# Patient Record
Sex: Male | Born: 1940
Health system: Southern US, Community
[De-identification: ages and names within clinical notes are randomized; demographics above are authoritative.]

## PROBLEM LIST (undated history)

## (undated) DIAGNOSIS — I10 Essential (primary) hypertension: Secondary | ICD-10-CM

## (undated) DIAGNOSIS — C801 Malignant (primary) neoplasm, unspecified: Secondary | ICD-10-CM

## (undated) DIAGNOSIS — E119 Type 2 diabetes mellitus without complications: Secondary | ICD-10-CM

## (undated) HISTORY — DX: Essential (primary) hypertension: I10

## (undated) HISTORY — PX: EYE SURGERY: SHX253

## (undated) HISTORY — DX: Type 2 diabetes mellitus without complications: E11.9

---

## 1998-07-11 ENCOUNTER — Ambulatory Visit: Admission: RE | Admit: 1998-07-11 | Discharge: 1998-07-11 | Payer: Self-pay | Admitting: Family Medicine

## 1998-08-30 ENCOUNTER — Ambulatory Visit: Admission: RE | Admit: 1998-08-30 | Discharge: 1998-08-30 | Payer: Self-pay | Admitting: Family Medicine

## 1998-11-21 ENCOUNTER — Emergency Department (HOSPITAL_COMMUNITY): Admission: EM | Admit: 1998-11-21 | Discharge: 1998-11-21 | Payer: Self-pay | Admitting: Emergency Medicine

## 1999-01-26 ENCOUNTER — Encounter: Payer: Self-pay | Admitting: Emergency Medicine

## 1999-01-26 ENCOUNTER — Emergency Department (HOSPITAL_COMMUNITY): Admission: EM | Admit: 1999-01-26 | Discharge: 1999-01-26 | Payer: Self-pay | Admitting: Emergency Medicine

## 2002-05-03 ENCOUNTER — Encounter: Admission: RE | Admit: 2002-05-03 | Discharge: 2002-08-01 | Payer: Self-pay | Admitting: Family Medicine

## 2002-06-02 ENCOUNTER — Encounter: Admission: RE | Admit: 2002-06-02 | Discharge: 2002-06-02 | Payer: Self-pay | Admitting: Urology

## 2002-06-02 ENCOUNTER — Encounter: Payer: Self-pay | Admitting: Urology

## 2002-06-08 ENCOUNTER — Ambulatory Visit: Admission: RE | Admit: 2002-06-08 | Discharge: 2002-09-06 | Payer: Self-pay | Admitting: Radiation Oncology

## 2002-07-27 ENCOUNTER — Encounter: Admission: RE | Admit: 2002-07-27 | Discharge: 2002-07-27 | Payer: Self-pay | Admitting: Urology

## 2002-07-27 ENCOUNTER — Encounter: Payer: Self-pay | Admitting: Urology

## 2002-08-30 ENCOUNTER — Ambulatory Visit (HOSPITAL_BASED_OUTPATIENT_CLINIC_OR_DEPARTMENT_OTHER): Admission: RE | Admit: 2002-08-30 | Discharge: 2002-08-30 | Payer: Self-pay | Admitting: Urology

## 2002-08-30 ENCOUNTER — Encounter: Payer: Self-pay | Admitting: Urology

## 2002-09-20 ENCOUNTER — Ambulatory Visit: Admission: RE | Admit: 2002-09-20 | Discharge: 2002-10-05 | Payer: Self-pay | Admitting: Radiation Oncology

## 2014-07-19 ENCOUNTER — Ambulatory Visit (INDEPENDENT_AMBULATORY_CARE_PROVIDER_SITE_OTHER): Payer: 59

## 2014-07-19 VITALS — BP 152/88 | HR 77 | Resp 12

## 2014-07-19 DIAGNOSIS — M79673 Pain in unspecified foot: Secondary | ICD-10-CM

## 2014-07-19 DIAGNOSIS — E1149 Type 2 diabetes mellitus with other diabetic neurological complication: Secondary | ICD-10-CM

## 2014-07-19 DIAGNOSIS — M79609 Pain in unspecified limb: Secondary | ICD-10-CM

## 2014-07-19 DIAGNOSIS — E1142 Type 2 diabetes mellitus with diabetic polyneuropathy: Secondary | ICD-10-CM

## 2014-07-19 DIAGNOSIS — E1140 Type 2 diabetes mellitus with diabetic neuropathy, unspecified: Secondary | ICD-10-CM

## 2014-07-19 DIAGNOSIS — B351 Tinea unguium: Secondary | ICD-10-CM

## 2014-07-19 NOTE — Patient Instructions (Signed)
Diabetes and Foot Care Diabetes may cause you to have problems because of poor blood supply (circulation) to your feet and legs. This may cause the skin on your feet to become thinner, break easier, and heal more slowly. Your skin may become dry, and the skin may peel and crack. You may also have nerve damage in your legs and feet causing decreased feeling in them. You may not notice minor injuries to your feet that could lead to infections or more serious problems. Taking care of your feet is one of the most important things you can do for yourself.  HOME CARE INSTRUCTIONS  Wear shoes at all times, even in the house. Do not go barefoot. Bare feet are easily injured.  Check your feet daily for blisters, cuts, and redness. If you cannot see the bottom of your feet, use a mirror or ask someone for help.  Wash your feet with warm water (do not use hot water) and mild soap. Then pat your feet and the areas between your toes until they are completely dry. Do not soak your feet as this can dry your skin.  Apply a moisturizing lotion or petroleum jelly (that does not contain alcohol and is unscented) to the skin on your feet and to dry, brittle toenails. Do not apply lotion between your toes.  Trim your toenails straight across. Do not dig under them or around the cuticle. File the edges of your nails with an emery board or nail file.  Do not cut corns or calluses or try to remove them with medicine.  Wear clean socks or stockings every day. Make sure they are not too tight. Do not wear knee-high stockings since they may decrease blood flow to your legs.  Wear shoes that fit properly and have enough cushioning. To break in new shoes, wear them for just a few hours a day. This prevents you from injuring your feet. Always look in your shoes before you put them on to be sure there are no objects inside.  Do not cross your legs. This may decrease the blood flow to your feet.  If you find a minor scrape,  cut, or break in the skin on your feet, keep it and the skin around it clean and dry. These areas may be cleansed with mild soap and water. Do not cleanse the area with peroxide, alcohol, or iodine.  When you remove an adhesive bandage, be sure not to damage the skin around it.  If you have a wound, look at it several times a day to make sure it is healing.  Do not use heating pads or hot water bottles. They may burn your skin. If you have lost feeling in your feet or legs, you may not know it is happening until it is too late.  Make sure your health care provider performs a complete foot exam at least annually or more often if you have foot problems. Report any cuts, sores, or bruises to your health care provider immediately. SEEK MEDICAL CARE IF:   You have an injury that is not healing.  You have cuts or breaks in the skin.  You have an ingrown nail.  You notice redness on your legs or feet.  You feel burning or tingling in your legs or feet.  You have pain or cramps in your legs and feet.  Your legs or feet are numb.  Your feet always feel cold. SEEK IMMEDIATE MEDICAL CARE IF:   There is increasing redness,   swelling, or pain in or around a wound.  There is a red line that goes up your leg.  Pus is coming from a wound.  You develop a fever or as directed by your health care provider.  You notice a bad smell coming from an ulcer or wound. Document Released: 11/15/2000 Document Revised: 07/21/2013 Document Reviewed: 04/27/2013 [employer] Patient Information 2015 [employer]. This information is not intended to replace advice given to you by your health care provider. Make sure you discuss any questions you have with your health care provider.  

## 2014-07-19 NOTE — Progress Notes (Signed)
   Subjective:    Patient ID: Eric Jones, male    DOB: 01/22/1941, 73 y.o.   MRN: [id number]  HPI PT STATED BEING DIABETIC TOENAILS ARE THICK AND LONG NEED TO BE TRIM.   Review of Systems  HENT: Positive for hearing loss.   Eyes: Positive for itching.  Skin: Positive for color change.  All other systems reviewed and are negative.      Objective:   Physical Exam 73 year old white male well-developed well-nourished oriented x3 presents at this time for nail care patient's wife has tried trimming the past is unable to do so he is unable to cut his own nails thickness and discoloration deformity. Extremity objective findings as follows vascular status is intact although diminished DP and PT pulses plus one over 4 bilateral mild varicosities noted bilateral skin temperature warm to cool turgor diminished hair growth diminished absent distally nails thick brittle crumbly and friable with dystrophy discoloration and friability 1 through 5 bilateral. First bilateral most painful tender symptomatic with dark in discoloration and tenderness on palpation and shoe wear. Neurologically epicritic sensations diminished on Semmes Weinstein testing to the plantar forefoot arch and heel intact sensation dorsally and the ankle of both feet. There is normal plantar response and DTRs noted. Dermatologically skin color pigment normal hair growth absent distally nails orthotic is indicated open wounds ulcerations no secondary infection      Assessment & Plan:  Assessment this time his diabetes with history peripheral neuropathy and diabetic foot changes this time dystrophic painful mycotic nails debrided 1 through 5 bilateral return for future palliative nail care every 3 months literature and diabetic footcare also dispensed to maintain appropriate shoes and socks at all times next  [employer] DPM

## 2014-09-23 ENCOUNTER — Other Ambulatory Visit: Payer: Self-pay | Admitting: Family Medicine

## 2014-09-23 ENCOUNTER — Ambulatory Visit
Admission: RE | Admit: 2014-09-23 | Discharge: 2014-09-23 | Disposition: A | Discharge status: Home / Self Care | Payer: Medicare Other | Source: Ambulatory Visit | Attending: Family Medicine | Admitting: Family Medicine

## 2014-09-23 DIAGNOSIS — M25562 Pain in left knee: Secondary | ICD-10-CM

## 2014-10-24 ENCOUNTER — Encounter: Payer: Self-pay | Admitting: Podiatry

## 2014-10-24 ENCOUNTER — Ambulatory Visit (INDEPENDENT_AMBULATORY_CARE_PROVIDER_SITE_OTHER): Payer: 59 | Admitting: Podiatry

## 2014-10-24 DIAGNOSIS — B351 Tinea unguium: Secondary | ICD-10-CM

## 2014-10-24 DIAGNOSIS — M79676 Pain in unspecified toe(s): Secondary | ICD-10-CM

## 2014-10-24 NOTE — Progress Notes (Signed)
Patient ID: Eric Jones, male   DOB: 29-Dec-1940, 73 y.o.   MRN: [id number]  Subjective: This patient presents again complaining of painful toenails  Objective: Elongated, hypertrophic, discolored toenails 6-10  Assessment: Symptomatic onychomycoses History of peripheral neuropathy associated with diabetes  Plan: Debrided toenails 10 without a bleeding  Reappoint 3 months

## 2014-10-25 ENCOUNTER — Ambulatory Visit: Payer: 59

## 2015-01-23 ENCOUNTER — Encounter: Payer: Self-pay | Admitting: Podiatry

## 2015-01-23 ENCOUNTER — Ambulatory Visit (INDEPENDENT_AMBULATORY_CARE_PROVIDER_SITE_OTHER): Payer: Medicare Other | Admitting: Podiatry

## 2015-01-23 DIAGNOSIS — B351 Tinea unguium: Secondary | ICD-10-CM

## 2015-01-23 DIAGNOSIS — M79676 Pain in unspecified toe(s): Secondary | ICD-10-CM

## 2015-01-23 NOTE — Patient Instructions (Signed)
Diabetes and Foot Care Diabetes may cause you to have problems because of poor blood supply (circulation) to your feet and legs. This may cause the skin on your feet to become thinner, break easier, and heal more slowly. Your skin may become dry, and the skin may peel and crack. You may also have nerve damage in your legs and feet causing decreased feeling in them. You may not notice minor injuries to your feet that could lead to infections or more serious problems. Taking care of your feet is one of the most important things you can do for yourself.  HOME CARE INSTRUCTIONS  Wear shoes at all times, even in the house. Do not go barefoot. Bare feet are easily injured.  Check your feet daily for blisters, cuts, and redness. If you cannot see the bottom of your feet, use a mirror or ask someone for help.  Wash your feet with warm water (do not use hot water) and mild soap. Then pat your feet and the areas between your toes until they are completely dry. Do not soak your feet as this can dry your skin.  Apply a moisturizing lotion or petroleum jelly (that does not contain alcohol and is unscented) to the skin on your feet and to dry, brittle toenails. Do not apply lotion between your toes.  Trim your toenails straight across. Do not dig under them or around the cuticle. File the edges of your nails with an emery board or nail file.  Do not cut corns or calluses or try to remove them with medicine.  Wear clean socks or stockings every day. Make sure they are not too tight. Do not wear knee-high stockings since they may decrease blood flow to your legs.  Wear shoes that fit properly and have enough cushioning. To break in new shoes, wear them for just a few hours a day. This prevents you from injuring your feet. Always look in your shoes before you put them on to be sure there are no objects inside.  Do not cross your legs. This may decrease the blood flow to your feet.  If you find a minor scrape,  cut, or break in the skin on your feet, keep it and the skin around it clean and dry. These areas may be cleansed with mild soap and water. Do not cleanse the area with peroxide, alcohol, or iodine.  When you remove an adhesive bandage, be sure not to damage the skin around it.  If you have a wound, look at it several times a day to make sure it is healing.  Do not use heating pads or hot water bottles. They may burn your skin. If you have lost feeling in your feet or legs, you may not know it is happening until it is too late.  Make sure your health care provider performs a complete foot exam at least annually or more often if you have foot problems. Report any cuts, sores, or bruises to your health care provider immediately. SEEK MEDICAL CARE IF:   You have an injury that is not healing.  You have cuts or breaks in the skin.  You have an ingrown nail.  You notice redness on your legs or feet.  You feel burning or tingling in your legs or feet.  You have pain or cramps in your legs and feet.  Your legs or feet are numb.  Your feet always feel cold. SEEK IMMEDIATE MEDICAL CARE IF:   There is increasing redness,   swelling, or pain in or around a wound.  There is a red line that goes up your leg.  Pus is coming from a wound.  You develop a fever or as directed by your health care provider.  You notice a bad smell coming from an ulcer or wound. Document Released: 11/15/2000 Document Revised: 07/21/2013 Document Reviewed: 04/27/2013 [employer] Patient Information 2015 [employer]. This information is not intended to replace advice given to you by your health care provider. Make sure you discuss any questions you have with your health care provider.  

## 2015-01-24 NOTE — Progress Notes (Signed)
Patient ID: Eric Jones, male   DOB: 12-08-1940, 74 y.o.   MRN: [id number]  Subjective: This patient presents complaining of painful toenails and is requesting debridement  Objective: The toenails are hypertrophic, elongated, discolored and tender to palpation,6-10  Assessment: Symptomatic onychomycoses 6-10 History of diabetic peripheral neuropathy  Plan: Debrided toenails 10 without a bleeding  Reappoint 3 months

## 2015-04-24 ENCOUNTER — Ambulatory Visit: Payer: Medicare Other | Admitting: Podiatry

## 2015-05-22 ENCOUNTER — Emergency Department (HOSPITAL_COMMUNITY)
Admission: EM | Admit: 2015-05-22 | Discharge: 2015-05-22 | Disposition: A | Discharge status: Nursing Home (Includes ICF) | Payer: Medicare Other | Source: Home / Self Care | Attending: Family Medicine | Admitting: Family Medicine

## 2015-05-22 ENCOUNTER — Encounter (HOSPITAL_COMMUNITY): Payer: Self-pay | Admitting: *Deleted

## 2015-05-22 ENCOUNTER — Emergency Department (HOSPITAL_COMMUNITY): Payer: Medicare Other

## 2015-05-22 ENCOUNTER — Emergency Department (HOSPITAL_COMMUNITY)
Admission: EM | Admit: 2015-05-22 | Discharge: 2015-05-22 | Disposition: A | Discharge status: Home / Self Care | Payer: Medicare Other | Attending: Emergency Medicine | Admitting: Emergency Medicine

## 2015-05-22 ENCOUNTER — Encounter (HOSPITAL_COMMUNITY): Payer: Self-pay | Admitting: Emergency Medicine

## 2015-05-22 DIAGNOSIS — J209 Acute bronchitis, unspecified: Secondary | ICD-10-CM | POA: Diagnosis not present

## 2015-05-22 DIAGNOSIS — R0602 Shortness of breath: Secondary | ICD-10-CM

## 2015-05-22 DIAGNOSIS — I10 Essential (primary) hypertension: Secondary | ICD-10-CM | POA: Diagnosis not present

## 2015-05-22 DIAGNOSIS — Z7982 Long term (current) use of aspirin: Secondary | ICD-10-CM | POA: Insufficient documentation

## 2015-05-22 DIAGNOSIS — R600 Localized edema: Secondary | ICD-10-CM | POA: Diagnosis not present

## 2015-05-22 DIAGNOSIS — Z794 Long term (current) use of insulin: Secondary | ICD-10-CM | POA: Diagnosis not present

## 2015-05-22 DIAGNOSIS — Z79899 Other long term (current) drug therapy: Secondary | ICD-10-CM | POA: Insufficient documentation

## 2015-05-22 DIAGNOSIS — Z859 Personal history of malignant neoplasm, unspecified: Secondary | ICD-10-CM | POA: Diagnosis not present

## 2015-05-22 DIAGNOSIS — E119 Type 2 diabetes mellitus without complications: Secondary | ICD-10-CM | POA: Diagnosis not present

## 2015-05-22 DIAGNOSIS — R0609 Other forms of dyspnea: Secondary | ICD-10-CM

## 2015-05-22 HISTORY — DX: Malignant (primary) neoplasm, unspecified: C80.1

## 2015-05-22 LAB — BASIC METABOLIC PANEL
ANION GAP: 7 (ref 5–15)
BUN: 20 mg/dL (ref 6–20)
CALCIUM: 9.1 mg/dL (ref 8.9–10.3)
CO2: 26 mmol/L (ref 22–32)
Chloride: 106 mmol/L (ref 101–111)
Creatinine, Ser: 0.91 mg/dL (ref 0.61–1.24)
GFR calc Af Amer: >60 mL/min (ref >60)
Glucose, Bld: 92 mg/dL (ref 65–99)
Potassium: 4 mmol/L (ref 3.5–5.1)
Sodium: 139 mmol/L (ref 135–145)

## 2015-05-22 LAB — CBC
HCT: 42.7 % (ref 39.0–52.0)
HEMOGLOBIN: 14 g/dL (ref 13.0–17.0)
MCH: 28.6 pg (ref 26.0–34.0)
MCHC: 32.8 g/dL (ref 30.0–36.0)
MCV: 87.1 fL (ref 78.0–100.0)
PLATELETS: 363 10*3/uL (ref 150–400)
RBC: 4.9 MIL/uL (ref 4.22–5.81)
RDW: 14.2 % (ref 11.5–15.5)
WBC: 8.7 10*3/uL (ref 4.0–10.5)

## 2015-05-22 LAB — BRAIN NATRIURETIC PEPTIDE: B Natriuretic Peptide: 159.9 pg/mL — ABNORMAL HIGH (ref 0.0–100.0)

## 2015-05-22 LAB — I-STAT TROPONIN, ED: TROPONIN I, POC: 0.02 ng/mL (ref 0.00–0.08)

## 2015-05-22 LAB — D-DIMER, QUANTITATIVE: D-Dimer, Quant: 0.52 ug/mL-FEU — ABNORMAL HIGH (ref 0.00–0.48)

## 2015-05-22 MED ORDER — BENZONATATE 100 MG PO CAPS: 100.0000 mg | ORAL_CAPSULE | Freq: Three times a day (TID) | ORAL | Status: AC

## 2015-05-22 MED ORDER — IOHEXOL 350 MG/ML SOLN
80.0000 mL | Freq: Once | INTRAVENOUS | Status: AC | PRN
  Administered 2015-05-22: 80 mL via INTRAVENOUS

## 2015-05-22 MED ORDER — ALBUTEROL SULFATE HFA 108 (90 BASE) MCG/ACT IN AERS
1.0000 | INHALATION_SPRAY | Freq: Four times a day (QID) | RESPIRATORY_TRACT | Status: DC | PRN
  Administered 2015-05-22: 2 via RESPIRATORY_TRACT
  Filled 2015-05-22: qty 6.7

## 2015-05-22 NOTE — ED Notes (Signed)
Pt being transferred to the ED via our shuttle for further evaluation of CP.  Pt is A&O and ambulatory to the lobby.  Report was called to Reita C[facility] RN ED.

## 2015-05-22 NOTE — ED Notes (Signed)
Pt complains of a cough, congestion in his ears and nose, shortness of breath and right sided chest pain for one week.  He states a week ago he had right shoulder pain that radiated down his right arm and caused significant numbness in his hand.  Pt also has an elevated BP of 194/102.

## 2015-05-22 NOTE — ED Provider Notes (Signed)
Eric Jones is a 74 y.o. male who presents to Urgent Care today for shortness of breath on exertion. Patient notes a 1 week history of SOB with exertion associated with internment right hand numbness and weakness and leg swelling. He also notes an inability to lay flat at night due to SOB and cough. He notes 5/10 right chest pain. He currently does not have the chest pain but notes that it is nonexertional. No palpitations fevers or chills. He does note some wheezing. He's tried Aleve and Flonase which have not helped. Personal history of heart failure or myocardial infarction or stroke.    Past Medical History  Diagnosis Date  . Hypertension   . Diabetes mellitus without complication    History reviewed. No pertinent past surgical history. History  Substance Use Topics  . Smoking status: Never Smoker   . Smokeless tobacco: Never Used  . Alcohol Use: No   ROS as above Medications: No current facility-administered medications for this encounter.   Current Outpatient Prescriptions  Medication Sig Dispense Refill  . aspirin 81 MG tablet Take 81 mg by mouth daily.    Marland Kitchen glimepiride (AMARYL) 4 MG tablet Take 4 mg by mouth daily with breakfast.    . Insulin Glargine (LANTUS SOLOSTAR [place]) Inject into the skin.    Marland Kitchen lisinopril-hydrochlorothiazide (PRINZIDE,ZESTORETIC) 20-12.5 MG per tablet Take 1 tablet by mouth daily.    . pioglitazone (ACTOS) 30 MG tablet Take 30 mg by mouth daily.    . simvastatin (ZOCOR) 40 MG tablet Take 40 mg by mouth daily.     No Known Allergies   Exam:  BP 194/102 mmHg  Pulse 93  Temp(Src) 98.7 F (37.1 C) (Oral)  SpO2 97% Gen: Well NAD obese  HEENT: EOMI,  MMM Lungs: Normal work of breathing. CTABL Heart: RRR no MRG Abd: NABS, Soft. Nondistended, Nontender Exts: Brisk capillary refill, warm and well perfused. 1+ pitting edema bilateral lower extremities  ED ECG REPORT   Date: 05/22/2015  Rate: 88  Rhythm: normal sinus rhythm  QRS Axis: left  Intervals: normal  ST/T Wave abnormalities: Difficult to interpret due to interventricular conduction delay. No large ST segment elevation or depression present.  Conduction Disutrbances:nonspecific intraventricular conduction delay  Narrative Interpretation:   Old EKG Reviewed: none available  I have personally reviewed the EKG tracing and agree with the computerized printout as noted.   No results found for this or any previous visit (from the past 24 hour(s)). No results found.  Assessment and Plan: 74 y.o. male with shortness of breath with exertion with lower extremity edema orthopnea and intermittent chest pain. He currently is chest pain-free and does not have any lightheadedness or presyncopal feelings. Transfer to ED for further evaluation and management via shuttle.  Discussed warning signs or symptoms. Please see discharge instructions. Patient expresses understanding.     Gregor Hams, MD 05/22/15 1339

## 2015-05-22 NOTE — ED Notes (Signed)
Pt states that he has had SOB and chest pain for 1 week. Pt states that pain is right sided, intermittent and aching.

## 2015-05-22 NOTE — Discharge Instructions (Signed)
Acute Bronchitis Bronchitis is inflammation of the airways that extend from the windpipe into the lungs (bronchi). The inflammation often causes mucus to develop. This leads to a cough, which is the most common symptom of bronchitis.  In acute bronchitis, the condition usually develops suddenly and goes away over time, usually in a couple weeks. Smoking, allergies, and asthma can make bronchitis worse. Repeated episodes of bronchitis may cause further lung problems.  CAUSES Acute bronchitis is most often caused by the same virus that causes a cold. The virus can spread from person to person (contagious) through coughing, sneezing, and touching contaminated objects. SIGNS AND SYMPTOMS   Cough.   Fever.   Coughing up mucus.   Body aches.   Chest congestion.   Chills.   Shortness of breath.   Sore throat.  DIAGNOSIS  Acute bronchitis is usually diagnosed through a physical exam. Your health care provider will also ask you questions about your medical history. Tests, such as chest X-rays, are sometimes done to rule out other conditions.  TREATMENT  Acute bronchitis usually goes away in a couple weeks. Oftentimes, no medical treatment is necessary. Medicines are sometimes given for relief of fever or cough. Antibiotic medicines are usually not needed but may be prescribed in certain situations. In some cases, an inhaler may be recommended to help reduce shortness of breath and control the cough. A cool mist vaporizer may also be used to help thin bronchial secretions and make it easier to clear the chest.  HOME CARE INSTRUCTIONS  Get plenty of rest.   Drink enough fluids to keep your urine clear or pale yellow (unless you have a medical condition that requires fluid restriction). Increasing fluids may help thin your respiratory secretions (sputum) and reduce chest congestion, and it will prevent dehydration.   Take medicines only as directed by your health care provider.  If  you were prescribed an antibiotic medicine, finish it all even if you start to feel better.  Avoid smoking and secondhand smoke. Exposure to cigarette smoke or irritating chemicals will make bronchitis worse. If you are a smoker, consider using nicotine gum or skin patches to help control withdrawal symptoms. Quitting smoking will help your lungs heal faster.   Reduce the chances of another bout of acute bronchitis by washing your hands frequently, avoiding people with cold symptoms, and trying not to touch your hands to your mouth, nose, or eyes.   Keep all follow-up visits as directed by your health care provider.  SEEK MEDICAL CARE IF: Your symptoms do not improve after 1 week of treatment.  SEEK IMMEDIATE MEDICAL CARE IF:  You develop an increased fever or chills.   You have chest pain.   You have severe shortness of breath.  You have bloody sputum.   You develop dehydration.  You faint or repeatedly feel like you are going to pass out.  You develop repeated vomiting.  You develop a severe headache. MAKE SURE YOU:   Understand these instructions.  Will watch your condition.  Will get help right away if you are not doing well or get worse. Document Released: 12/26/2004 Document Revised: 04/04/2014 Document Reviewed: 05/11/2013 [facility] Patient Information 2015 [place], [place]. This information is not intended to replace advice given to you by your health care provider. Make sure you discuss any questions you have with your health care provider.  Shortness of Breath Shortness of breath means you have trouble breathing. It could also mean that you have a medical problem. You should  get immediate medical care for shortness of breath. CAUSES   Not enough oxygen in the air such as with high altitudes or a smoke-filled room.  Certain lung diseases, infections, or problems.  Heart disease or conditions, such as angina or heart failure.  Low red blood cells  (anemia).  Poor physical fitness, which can cause shortness of breath when you exercise.  Chest or back injuries or stiffness.  Being overweight.  Smoking.  Anxiety, which can make you feel like you are not getting enough air. DIAGNOSIS  Serious medical problems can often be found during your physical exam. Tests may also be done to determine why you are having shortness of breath. Tests may include:  Chest X-rays.  Lung function tests.  Blood tests.  An electrocardiogram (ECG).  An ambulatory electrocardiogram. An ambulatory ECG records your heartbeat patterns over a 24-hour period.  Exercise testing.  A transthoracic echocardiogram (TTE). During echocardiography, sound waves are used to evaluate how blood flows through your heart.  A transesophageal echocardiogram (TEE).  Imaging scans. Your health care provider may not be able to find a cause for your shortness of breath after your exam. In this case, it is important to have a follow-up exam with your health care provider as directed.  TREATMENT  Treatment for shortness of breath depends on the cause of your symptoms and can vary greatly. HOME CARE INSTRUCTIONS   Do not smoke. Smoking is a common cause of shortness of breath. If you smoke, ask for help to quit.  Avoid being around chemicals or things that may bother your breathing, such as paint fumes and dust.  Rest as needed. Slowly resume your usual activities.  If medicines were prescribed, take them as directed for the full length of time directed. This includes oxygen and any inhaled medicines.  Keep all follow-up appointments as directed by your health care provider. SEEK MEDICAL CARE IF:   Your condition does not improve in the time expected.  You have a hard time doing your normal activities even with rest.  You have any new symptoms. SEEK IMMEDIATE MEDICAL CARE IF:   Your shortness of breath gets worse.  You feel light-headed, faint, or develop a  cough not controlled with medicines.  You start coughing up blood.  You have pain with breathing.  You have chest pain or pain in your arms, shoulders, or abdomen.  You have a fever.  You are unable to walk up stairs or exercise the way you normally do. MAKE SURE YOU:  Understand these instructions.  Will watch your condition.  Will get help right away if you are not doing well or get worse. Document Released: 08/13/2001 Document Revised: 11/23/2013 Document Reviewed: 02/03/2012 [facility] Patient Information 2015 [place], [place]. This information is not intended to replace advice given to you by your health care provider. Make sure you discuss any questions you have with your health care provider.

## 2015-05-22 NOTE — ED Notes (Signed)
Pt ambulated well and spo2 sat remained between 97% & 100%

## 2015-05-22 NOTE — ED Provider Notes (Signed)
CSN: [id number]     Arrival date & time 05/22/15  1352 History   First MD Initiated Contact with Patient 05/22/15 1556     Chief Complaint  Patient presents with  . Shortness of Breath  . Chest Pain     (Consider location/radiation/quality/duration/timing/severity/associated sxs/prior Treatment) HPI Patient is a 74 year old male with past medical history of diabetes, hypertension who presents the ER complaining of 2-3 weeks of cough, one week of dyspnea on exertion and orthopnea. Patient reports cough is worse at night, it is also worse with lying flat. Patient reports an interval right-sided lower anterior chest discomfort as well. This is been intermittent throughout the week. Patient states this pain is intermittent without aggravating or alleviating factors. There is no associated exertional component to it. He states it begins for seconds to minutes at a time, then is gone for hours to days. Patient denies any associated nausea, vomiting, headache, dizziness, weakness, lightheadedness, jaw pain, arm pain, diaphoresis, palpitations, abdominal pain, diarrhea, dysuria.  Past Medical History  Diagnosis Date  . Hypertension   . Diabetes mellitus without complication   . Cancer    History reviewed. No pertinent past surgical history. No family history on file. History  Substance Use Topics  . Smoking status: Never Smoker   . Smokeless tobacco: Never Used  . Alcohol Use: No    Review of Systems  Constitutional: Negative for fever.  HENT: Negative for trouble swallowing.   Eyes: Negative for visual disturbance.  Respiratory: Positive for cough and shortness of breath.   Cardiovascular: Negative for chest pain.  Gastrointestinal: Negative for nausea, vomiting and abdominal pain.  Genitourinary: Negative for dysuria.  Musculoskeletal: Negative for neck pain.       R lower chest pain  Skin: Negative for rash.  Neurological: Negative for dizziness, weakness and numbness.   Psychiatric/Behavioral: Negative.     Allergies  Review of patient's allergies indicates no known allergies.  Home Medications   Prior to Admission medications   Medication Sig Start Date End Date Taking? Authorizing Provider  aspirin 81 MG tablet Take 81 mg by mouth daily.   Yes Historical Provider, MD  fluticasone (FLONASE) 50 MCG/ACT nasal spray Place 1 spray into both nostrils as needed for allergies or rhinitis.   Yes Historical Provider, MD  glimepiride (AMARYL) 4 MG tablet Take 4 mg by mouth daily with breakfast.   Yes Historical Provider, MD  Insulin Glargine (LANTUS SOLOSTAR) 100 UNIT/ML Solostar Pen Inject 25 Units into the skin 2 (two) times daily.   Yes Historical Provider, MD  lisinopril-hydrochlorothiazide (PRINZIDE,ZESTORETIC) 20-12.5 MG per tablet Take 1 tablet by mouth daily.   Yes Historical Provider, MD  Naproxen Sod-Diphenhydramine (ALEVE PM) 220-25 MG TABS Take 2 tablets by mouth at bedtime as needed (sleep).   Yes Historical Provider, MD  pioglitazone (ACTOS) 30 MG tablet Take 30 mg by mouth daily.   Yes Historical Provider, MD  simvastatin (ZOCOR) 40 MG tablet Take 40 mg by mouth every other day.    Yes Historical Provider, MD  benzonatate (TESSALON) 100 MG capsule Take 1 capsule (100 mg total) by mouth every 8 (eight) hours. 05/22/15   Dahlia Bailiff, PA-C   BP 166/90 mmHg  Pulse 85  Temp(Src) 98.1 F (36.7 C) (Oral)  Resp 16  SpO2 96% Physical Exam  Constitutional: He is oriented to person, place, and time. He appears well-developed and well-nourished. No distress.  HENT:  Head: Normocephalic and atraumatic.  Mouth/Throat: Oropharynx is clear and moist. No oropharyngeal  exudate.  Eyes: Right eye exhibits no discharge. Left eye exhibits no discharge. No scleral icterus.  Neck: Normal range of motion.  Cardiovascular: Normal rate, regular rhythm and normal heart sounds.   No murmur heard. Pulmonary/Chest: Effort normal. No accessory muscle usage. No tachypnea. No  respiratory distress. He has no wheezes. He has no rhonchi. He has rales in the right lower field and the left lower field.  Mild, bibasilar rales noted. No respiratory distress. Patient speaking in full, clear sentences.  Abdominal: Soft. There is no tenderness.  Musculoskeletal: Normal range of motion. He exhibits no edema or tenderness.  Lymphadenopathy:  1+ pitting edema noted bilaterally and ankles.  Neurological: He is alert and oriented to person, place, and time. He has normal strength. No cranial nerve deficit or sensory deficit. Coordination normal. GCS eye subscore is 4. GCS verbal subscore is 5. GCS motor subscore is 6.  Patient fully alert, answering questions appropriately in full, clear sentences. Cranial nerves II through XII grossly intact. Motor strength 5 out of 5 in all major muscle groups of upper and lower extremities. Distal sensation intact.   Skin: Skin is warm and dry. No rash noted. He is not diaphoretic.  Psychiatric: He has a normal mood and affect.  Nursing note and vitals reviewed.   ED Course  Procedures (including critical care time) Labs Review Labs Reviewed  BRAIN NATRIURETIC PEPTIDE - Abnormal; Notable for the following:    B Natriuretic Peptide 159.9 (*)    All other components within normal limits  D-DIMER, QUANTITATIVE (NOT AT [facility]) - Abnormal; Notable for the following:    D-Dimer, Quant 0.52 (*)    All other components within normal limits  CBC  BASIC METABOLIC PANEL  I-STAT TROPOININ, ED    Imaging Review Dg Chest 2 View  05/22/2015   CLINICAL DATA:  Shortness of breath and chest pain  EXAM: CHEST  2 VIEW  COMPARISON:  None.  FINDINGS: Limited lateral imaging due to rotation.  Interstitial coarsening which is likely bronchitic and accentuated by hypoventilation. There is no edema, consolidation, effusion, or pneumothorax. Normal heart size for technique. Negative aortic and hilar contours.  IMPRESSION: Bronchitic markings and hypoventilation.   No pneumonia or edema.   Electronically Signed   By: Monte Fantasia M.D.   On: 05/22/2015 15:30   Ct Angio Chest Pe W/cm &/or Wo Cm  05/22/2015   CLINICAL DATA:  Shortness of Breath with exertion. Symptoms for 1 week. Intermittent right hand numbness and weakness. Leg swelling. Five out a 10 chest pain on the right.  EXAM: CT ANGIOGRAPHY CHEST WITH CONTRAST  TECHNIQUE: Multidetector CT imaging of the chest was performed using the standard protocol during bolus administration of intravenous contrast. Multiplanar CT image reconstructions and MIPs were obtained to evaluate the vascular anatomy.  CONTRAST:  33mL OMNIPAQUE IOHEXOL 350 MG/ML SOLN  COMPARISON:  Chest x-ray earlier today.  FINDINGS: CT CHEST FINDINGS  Mediastinum/Nodes: No filling defects in the pulmonary arteries to suggest pulmonary emboli. Heart is mildly enlarged. Scattered calcifications in the left anterior descending coronary artery. Aorta is tortuous, non aneurysmal. No mediastinal, hilar, or axillary adenopathy.  Lungs/Pleura: Lungs are clear. No focal airspace opacities or suspicious nodules. No effusions.  Chest wall: Chest wall soft tissues are unremarkable.  Upper abdomen: No acute findings in the visualized upper abdomen.  Musculoskeletal: No acute bony abnormality or focal bone lesion.  Review of the MIP images confirms the above findings.  IMPRESSION: No evidence of pulmonary embolus.  Borderline heart size.  Scattered calcifications in the LAD.   Electronically Signed   By: Rolm Baptise M.D.   On: 05/22/2015 20:39     EKG Interpretation   Date/Time:  Monday May 22 2015 13:57:31 EDT Ventricular Rate:  89 PR Interval:  194 QRS Duration: 150 QT Interval:  412 QTC Calculation: 501 R Axis:   -61 Text Interpretation:  Normal sinus rhythm Left axis deviation Left  ventricular hypertrophy with QRS widening Abnormal ECG no significant  change since earlier in the day Confirmed by GOLDSTON  MD, SCOTT ([id number]) on  05/22/2015 3:56:52  PM      MDM   Final diagnoses:  SOB (shortness of breath)  Acute bronchitis, unspecified organism  DOE (dyspnea on exertion)    74 yom with several weeks of cough, one week of DOE and orthopnea.  Subjective cough worse at night.  Intermittent cp R lower anterior-- atypical, non-exertional. No concern for anginal chest pain. No cardiac hx.   Bibasilar rales on exam.  Bilateral LE edema.  No distress at rest.  CXR with bronchitic changes, however pt looks mildly fluid-overloaded on exam.  Acute bronchitis vs. ?fluid overload.  No cardiac w/u in past. Patient well-appearing on exam, in no acute distress. Patient having no respiratory distress throughout ER stay. EKG unremarkable for acute pathology, troponin negative after 1 week of onset of symptoms. Do not believe signs symptoms are anginal equivalent at this time.  7:00 PM: Spoke to patient regarding elevated d-dimer, discussed risks/benefits of CT angios, studies of a digested d-dimer. Patient prefers to undergo CT image at this time.  CT of chest with impression: No evidence of pulmonary embolus.  Borderline heart size. Scattered calcifications in the LAD.  Patient continues to be asymptomatic throughout ER stay. Discussed the importance of further cardiac workup given patient's risk factors along with patient not having cardiac workup in the past. With patient's cough, and mild bronchitic changes noted on x-ray coupled with overall unimpressive workup here, likely patient's signs and symptoms are attributed to a and acute bronchitis. Patient afebrile, hemodynamic be stable and in no acute distress. Patient ambulated well throughout the ED with maintaining oxygen saturations at 97% or above. Patient stable for discharge. Strongly encouraged follow-up with PCP, return precautions discussed patient verbalizes understanding and agreement of this plan.  BP 166/90 mmHg  Pulse 85  Temp(Src) 98.1 F (36.7 C) (Oral)  Resp 16  SpO2  96%  Signed,  Dahlia Bailiff, PA-C 1:48 AM  Patient seen and discussed with Dr. Sherwood Gambler, MD  Dahlia Bailiff, PA-C 05/23/15 0148  Sherwood Gambler, MD 05/26/15 1038

## 2015-05-31 ENCOUNTER — Ambulatory Visit (INDEPENDENT_AMBULATORY_CARE_PROVIDER_SITE_OTHER): Payer: Medicare Other | Admitting: Podiatry

## 2015-05-31 ENCOUNTER — Encounter: Payer: Self-pay | Admitting: Podiatry

## 2015-05-31 DIAGNOSIS — E1140 Type 2 diabetes mellitus with diabetic neuropathy, unspecified: Secondary | ICD-10-CM

## 2015-05-31 DIAGNOSIS — B351 Tinea unguium: Secondary | ICD-10-CM

## 2015-05-31 NOTE — Patient Instructions (Signed)
Removed bandage on fourth right toe 1-2 days. Apply topical anabiotic ointment to the fourth right toe daily until a scab forms  Diabetes and Foot Care Diabetes may cause you to have problems because of poor blood supply (circulation) to your feet and legs. This may cause the skin on your feet to become thinner, break easier, and heal more slowly. Your skin may become dry, and the skin may peel and crack. You may also have nerve damage in your legs and feet causing decreased feeling in them. You may not notice minor injuries to your feet that could lead to infections or more serious problems. Taking care of your feet is one of the most important things you can do for yourself.  HOME CARE INSTRUCTIONS  Wear shoes at all times, even in the house. Do not go barefoot. Bare feet are easily injured.  Check your feet daily for blisters, cuts, and redness. If you cannot see the bottom of your feet, use a mirror or ask someone for help.  Wash your feet with warm water (do not use hot water) and mild soap. Then pat your feet and the areas between your toes until they are completely dry. Do not soak your feet as this can dry your skin.  Apply a moisturizing lotion or petroleum jelly (that does not contain alcohol and is unscented) to the skin on your feet and to dry, brittle toenails. Do not apply lotion between your toes.  Trim your toenails straight across. Do not dig under them or around the cuticle. File the edges of your nails with an emery board or nail file.  Do not cut corns or calluses or try to remove them with medicine.  Wear clean socks or stockings every day. Make sure they are not too tight. Do not wear knee-high stockings since they may decrease blood flow to your legs.  Wear shoes that fit properly and have enough cushioning. To break in new shoes, wear them for just a few hours a day. This prevents you from injuring your feet. Always look in your shoes before you put them on to be sure  there are no objects inside.  Do not cross your legs. This may decrease the blood flow to your feet.  If you find a minor scrape, cut, or break in the skin on your feet, keep it and the skin around it clean and dry. These areas may be cleansed with mild soap and water. Do not cleanse the area with peroxide, alcohol, or iodine.  When you remove an adhesive bandage, be sure not to damage the skin around it.  If you have a wound, look at it several times a day to make sure it is healing.  Do not use heating pads or hot water bottles. They may burn your skin. If you have lost feeling in your feet or legs, you may not know it is happening until it is too late.  Make sure your health care provider performs a complete foot exam at least annually or more often if you have foot problems. Report any cuts, sores, or bruises to your health care provider immediately. SEEK MEDICAL CARE IF:   You have an injury that is not healing.  You have cuts or breaks in the skin.  You have an ingrown nail.  You notice redness on your legs or feet.  You feel burning or tingling in your legs or feet.  You have pain or cramps in your legs and feet.  Your legs or feet are numb.  Your feet always feel cold. SEEK IMMEDIATE MEDICAL CARE IF:   There is increasing redness, swelling, or pain in or around a wound.  There is a red line that goes up your leg.  Pus is coming from a wound.  You develop a fever or as directed by your health care provider.  You notice a bad smell coming from an ulcer or wound. Document Released: 11/15/2000 Document Revised: 07/21/2013 Document Reviewed: 04/27/2013 [facility] Patient Information 2015 [place], [place]. This information is not intended to replace advice given to you by your health care provider. Make sure you discuss any questions you have with your health care provider.

## 2015-05-31 NOTE — Progress Notes (Signed)
Patient ID: Eric Jones, male   DOB: 05-29-1941, 74 y.o.   MRN: [id number]  Subjective: This patient presents complaining of painful toenails and requesting nail debridement  Objective: The toenails are extremely elongated, brittle, discolored, incurvated 6-10  Assessment: Symptomatic onychomycosis 6-10 History of diabetic peripheral neuropathy  Plan: Debridement toenails 10 distal fourth right toe has pinpoint abrasion without any active bleeding post debridement. Anabiotic dressing applied to this area. Patient advised to continue applying topical anabiotic ointment daily after removing Band-Aid until a scab forms.  Reappoint at three-month intervals or as needed

## 2015-09-05 ENCOUNTER — Ambulatory Visit (INDEPENDENT_AMBULATORY_CARE_PROVIDER_SITE_OTHER): Payer: Medicare Other | Admitting: Podiatry

## 2015-09-05 ENCOUNTER — Encounter: Payer: Self-pay | Admitting: Podiatry

## 2015-09-05 DIAGNOSIS — E1140 Type 2 diabetes mellitus with diabetic neuropathy, unspecified: Secondary | ICD-10-CM

## 2015-09-05 DIAGNOSIS — M79676 Pain in unspecified toe(s): Secondary | ICD-10-CM

## 2015-09-05 DIAGNOSIS — B351 Tinea unguium: Secondary | ICD-10-CM

## 2015-09-05 NOTE — Patient Instructions (Signed)
Diabetes and Foot Care Diabetes may cause you to have problems because of poor blood supply (circulation) to your feet and legs. This may cause the skin on your feet to become thinner, break easier, and heal more slowly. Your skin may become dry, and the skin may peel and crack. You may also have nerve damage in your legs and feet causing decreased feeling in them. You may not notice minor injuries to your feet that could lead to infections or more serious problems. Taking care of your feet is one of the most important things you can do for yourself.  HOME CARE INSTRUCTIONS  Wear shoes at all times, even in the house. Do not go barefoot. Bare feet are easily injured.  Check your feet daily for blisters, cuts, and redness. If you cannot see the bottom of your feet, use a mirror or ask someone for help.  Wash your feet with warm water (do not use hot water) and mild soap. Then pat your feet and the areas between your toes until they are completely dry. Do not soak your feet as this can dry your skin.  Apply a moisturizing lotion or petroleum jelly (that does not contain alcohol and is unscented) to the skin on your feet and to dry, brittle toenails. Do not apply lotion between your toes.  Trim your toenails straight across. Do not dig under them or around the cuticle. File the edges of your nails with an emery board or nail file.  Do not cut corns or calluses or try to remove them with medicine.  Wear clean socks or stockings every day. Make sure they are not too tight. Do not wear knee-high stockings since they may decrease blood flow to your legs.  Wear shoes that fit properly and have enough cushioning. To break in new shoes, wear them for just a few hours a day. This prevents you from injuring your feet. Always look in your shoes before you put them on to be sure there are no objects inside.  Do not cross your legs. This may decrease the blood flow to your feet.  If you find a minor scrape,  cut, or break in the skin on your feet, keep it and the skin around it clean and dry. These areas may be cleansed with mild soap and water. Do not cleanse the area with peroxide, alcohol, or iodine.  When you remove an adhesive bandage, be sure not to damage the skin around it.  If you have a wound, look at it several times a day to make sure it is healing.  Do not use heating pads or hot water bottles. They may burn your skin. If you have lost feeling in your feet or legs, you may not know it is happening until it is too late.  Make sure your health care provider performs a complete foot exam at least annually or more often if you have foot problems. Report any cuts, sores, or bruises to your health care provider immediately. SEEK MEDICAL CARE IF:   You have an injury that is not healing.  You have cuts or breaks in the skin.  You have an ingrown nail.  You notice redness on your legs or feet.  You feel burning or tingling in your legs or feet.  You have pain or cramps in your legs and feet.  Your legs or feet are numb.  Your feet always feel cold. SEEK IMMEDIATE MEDICAL CARE IF:   There is increasing redness,   swelling, or pain in or around a wound.  There is a red line that goes up your leg.  Pus is coming from a wound.  You develop a fever or as directed by your health care provider.  You notice a bad smell coming from an ulcer or wound. Document Released: 11/15/2000 Document Revised: 07/21/2013 Document Reviewed: 04/27/2013 [employer] Patient Information 2015 [employer]. This information is not intended to replace advice given to you by your health care provider. Make sure you discuss any questions you have with your health care provider.  

## 2015-09-05 NOTE — Progress Notes (Signed)
Patient ID: Eric Jones, male   DOB: 07-30-1941, 74 y.o.   MRN: [id number]  Subjective: This patient presents for scheduled visit complaining of painful elongated toenails request nail debridement  Objective: Orientated 3 No open skin lesions noted bilaterally The toenails are elongated, brittle, incurvated, hypertrophic 6-10  Assessment: Type II diabetic with peripheral neuropathy Symptomatic onychomycoses 6-10  Plan: Debridement toenails 10 mechanically and electrically without any bleeding  Reappoint 3 months

## 2015-12-06 ENCOUNTER — Ambulatory Visit (INDEPENDENT_AMBULATORY_CARE_PROVIDER_SITE_OTHER): Payer: Medicare Other | Admitting: Podiatry

## 2015-12-06 ENCOUNTER — Encounter: Payer: Self-pay | Admitting: Podiatry

## 2015-12-06 DIAGNOSIS — M79676 Pain in unspecified toe(s): Secondary | ICD-10-CM | POA: Diagnosis not present

## 2015-12-06 DIAGNOSIS — E1140 Type 2 diabetes mellitus with diabetic neuropathy, unspecified: Secondary | ICD-10-CM | POA: Diagnosis not present

## 2015-12-06 DIAGNOSIS — B351 Tinea unguium: Secondary | ICD-10-CM

## 2015-12-06 NOTE — Progress Notes (Signed)
Patient ID: Eric Jones, male   DOB: 1941-06-27, 75 y.o.   MRN: [id number]  Subjective: This patient presents for scheduled visit complaining of elongated and thickened toenails which are on call for walking wearing shoes and requests toenail debridement  Objective: Orientated 3 No skin lesions bilaterally The toenails are incurvated, hypertrophic, discolored, deformed, and tender to direct palpation 6-10  Assessment: Symptomatic onychomycoses 6-10 Type II diabetic with peripheral neuropathy  Plan: [place] toenails 6-10 mechanically and allegedly without any bleeding  Reappoint 3 months

## 2015-12-06 NOTE — Patient Instructions (Signed)
Diabetes and Foot Care Diabetes may cause you to have problems because of poor blood supply (circulation) to your feet and legs. This may cause the skin on your feet to become thinner, break easier, and heal more slowly. Your skin may become dry, and the skin may peel and crack. You may also have nerve damage in your legs and feet causing decreased feeling in them. You may not notice minor injuries to your feet that could lead to infections or more serious problems. Taking care of your feet is one of the most important things you can do for yourself.  HOME CARE INSTRUCTIONS  Wear shoes at all times, even in the house. Do not go barefoot. Bare feet are easily injured.  Check your feet daily for blisters, cuts, and redness. If you cannot see the bottom of your feet, use a mirror or ask someone for help.  Wash your feet with warm water (do not use hot water) and mild soap. Then pat your feet and the areas between your toes until they are completely dry. Do not soak your feet as this can dry your skin.  Apply a moisturizing lotion or petroleum jelly (that does not contain alcohol and is unscented) to the skin on your feet and to dry, brittle toenails. Do not apply lotion between your toes.  Trim your toenails straight across. Do not dig under them or around the cuticle. File the edges of your nails with an emery board or nail file.  Do not cut corns or calluses or try to remove them with medicine.  Wear clean socks or stockings every day. Make sure they are not too tight. Do not wear knee-high stockings since they may decrease blood flow to your legs.  Wear shoes that fit properly and have enough cushioning. To break in new shoes, wear them for just a few hours a day. This prevents you from injuring your feet. Always look in your shoes before you put them on to be sure there are no objects inside.  Do not cross your legs. This may decrease the blood flow to your feet.  If you find a minor scrape,  cut, or break in the skin on your feet, keep it and the skin around it clean and dry. These areas may be cleansed with mild soap and water. Do not cleanse the area with peroxide, alcohol, or iodine.  When you remove an adhesive bandage, be sure not to damage the skin around it.  If you have a wound, look at it several times a day to make sure it is healing.  Do not use heating pads or hot water bottles. They may burn your skin. If you have lost feeling in your feet or legs, you may not know it is happening until it is too late.  Make sure your health care provider performs a complete foot exam at least annually or more often if you have foot problems. Report any cuts, sores, or bruises to your health care provider immediately. SEEK MEDICAL CARE IF:   You have an injury that is not healing.  You have cuts or breaks in the skin.  You have an ingrown nail.  You notice redness on your legs or feet.  You feel burning or tingling in your legs or feet.  You have pain or cramps in your legs and feet.  Your legs or feet are numb.  Your feet always feel cold. SEEK IMMEDIATE MEDICAL CARE IF:   There is increasing redness,   swelling, or pain in or around a wound.  There is a red line that goes up your leg.  Pus is coming from a wound.  You develop a fever or as directed by your health care provider.  You notice a bad smell coming from an ulcer or wound.   This information is not intended to replace advice given to you by your health care provider. Make sure you discuss any questions you have with your health care provider.   Document Released: 11/15/2000 Document Revised: 07/21/2013 Document Reviewed: 04/27/2013 Elsevier Interactive Patient Education 2016 [employer]  

## 2016-03-05 ENCOUNTER — Encounter: Payer: Self-pay | Admitting: Podiatry

## 2016-03-05 ENCOUNTER — Ambulatory Visit (INDEPENDENT_AMBULATORY_CARE_PROVIDER_SITE_OTHER): Payer: Medicare Other | Admitting: Podiatry

## 2016-03-05 DIAGNOSIS — E1140 Type 2 diabetes mellitus with diabetic neuropathy, unspecified: Secondary | ICD-10-CM | POA: Diagnosis not present

## 2016-03-05 DIAGNOSIS — M79676 Pain in unspecified toe(s): Secondary | ICD-10-CM | POA: Diagnosis not present

## 2016-03-05 DIAGNOSIS — B351 Tinea unguium: Secondary | ICD-10-CM

## 2016-03-05 NOTE — Progress Notes (Signed)
Patient ID: Eric Jones, male   DOB: 02-08-1941, 75 y.o.   MRN: [id number]    Subjective: This patient presents for scheduled visit complaining of elongated and thickened toenails which are on call for walking wearing shoes and requests toenail debridement  Objective: Orientated 3 No skin lesions bilaterally The toenails are incurvated, hypertrophic, discolored, deformed, and tender to direct palpation 6-10  Assessment: Symptomatic onychomycoses 6-10 Type II diabetic with peripheral neuropathy  Plan: Debridement of toenails 6-10 mechanically and electrically without any bleeding  Reappoint 3 months

## 2016-03-05 NOTE — Patient Instructions (Signed)
Diabetes and Foot Care Diabetes may cause you to have problems because of poor blood supply (circulation) to your feet and legs. This may cause the skin on your feet to become thinner, break easier, and heal more slowly. Your skin may become dry, and the skin may peel and crack. You may also have nerve damage in your legs and feet causing decreased feeling in them. You may not notice minor injuries to your feet that could lead to infections or more serious problems. Taking care of your feet is one of the most important things you can do for yourself.  HOME CARE INSTRUCTIONS  Wear shoes at all times, even in the house. Do not go barefoot. Bare feet are easily injured.  Check your feet daily for blisters, cuts, and redness. If you cannot see the bottom of your feet, use a mirror or ask someone for help.  Wash your feet with warm water (do not use hot water) and mild soap. Then pat your feet and the areas between your toes until they are completely dry. Do not soak your feet as this can dry your skin.  Apply a moisturizing lotion or petroleum jelly (that does not contain alcohol and is unscented) to the skin on your feet and to dry, brittle toenails. Do not apply lotion between your toes.  Trim your toenails straight across. Do not dig under them or around the cuticle. File the edges of your nails with an emery board or nail file.  Do not cut corns or calluses or try to remove them with medicine.  Wear clean socks or stockings every day. Make sure they are not too tight. Do not wear knee-high stockings since they may decrease blood flow to your legs.  Wear shoes that fit properly and have enough cushioning. To break in new shoes, wear them for just a few hours a day. This prevents you from injuring your feet. Always look in your shoes before you put them on to be sure there are no objects inside.  Do not cross your legs. This may decrease the blood flow to your feet.  If you find a minor scrape,  cut, or break in the skin on your feet, keep it and the skin around it clean and dry. These areas may be cleansed with mild soap and water. Do not cleanse the area with peroxide, alcohol, or iodine.  When you remove an adhesive bandage, be sure not to damage the skin around it.  If you have a wound, look at it several times a day to make sure it is healing.  Do not use heating pads or hot water bottles. They may burn your skin. If you have lost feeling in your feet or legs, you may not know it is happening until it is too late.  Make sure your health care provider performs a complete foot exam at least annually or more often if you have foot problems. Report any cuts, sores, or bruises to your health care provider immediately. SEEK MEDICAL CARE IF:   You have an injury that is not healing.  You have cuts or breaks in the skin.  You have an ingrown nail.  You notice redness on your legs or feet.  You feel burning or tingling in your legs or feet.  You have pain or cramps in your legs and feet.  Your legs or feet are numb.  Your feet always feel cold. SEEK IMMEDIATE MEDICAL CARE IF:   There is increasing redness,   swelling, or pain in or around a wound.  There is a red line that goes up your leg.  Pus is coming from a wound.  You develop a fever or as directed by your health care provider.  You notice a bad smell coming from an ulcer or wound.   This information is not intended to replace advice given to you by your health care provider. Make sure you discuss any questions you have with your health care provider.   Document Released: 11/15/2000 Document Revised: 07/21/2013 Document Reviewed: 04/27/2013 Elsevier Interactive Patient Education 2016 [employer]  

## 2016-06-05 ENCOUNTER — Ambulatory Visit (INDEPENDENT_AMBULATORY_CARE_PROVIDER_SITE_OTHER): Payer: Medicare Other | Admitting: Podiatry

## 2016-06-05 ENCOUNTER — Encounter: Payer: Self-pay | Admitting: Podiatry

## 2016-06-05 DIAGNOSIS — M79676 Pain in unspecified toe(s): Secondary | ICD-10-CM | POA: Diagnosis not present

## 2016-06-05 DIAGNOSIS — E1140 Type 2 diabetes mellitus with diabetic neuropathy, unspecified: Secondary | ICD-10-CM | POA: Diagnosis not present

## 2016-06-05 DIAGNOSIS — B351 Tinea unguium: Secondary | ICD-10-CM | POA: Diagnosis not present

## 2016-06-05 NOTE — Patient Instructions (Signed)
Diabetes and Foot Care Diabetes may cause you to have problems because of poor blood supply (circulation) to your feet and legs. This may cause the skin on your feet to become thinner, break easier, and heal more slowly. Your skin may become dry, and the skin may peel and crack. You may also have nerve damage in your legs and feet causing decreased feeling in them. You may not notice minor injuries to your feet that could lead to infections or more serious problems. Taking care of your feet is one of the most important things you can do for yourself.  HOME CARE INSTRUCTIONS  Wear shoes at all times, even in the house. Do not go barefoot. Bare feet are easily injured.  Check your feet daily for blisters, cuts, and redness. If you cannot see the bottom of your feet, use a mirror or ask someone for help.  Wash your feet with warm water (do not use hot water) and mild soap. Then pat your feet and the areas between your toes until they are completely dry. Do not soak your feet as this can dry your skin.  Apply a moisturizing lotion or petroleum jelly (that does not contain alcohol and is unscented) to the skin on your feet and to dry, brittle toenails. Do not apply lotion between your toes.  Trim your toenails straight across. Do not dig under them or around the cuticle. File the edges of your nails with an emery board or nail file.  Do not cut corns or calluses or try to remove them with medicine.  Wear clean socks or stockings every day. Make sure they are not too tight. Do not wear knee-high stockings since they may decrease blood flow to your legs.  Wear shoes that fit properly and have enough cushioning. To break in new shoes, wear them for just a few hours a day. This prevents you from injuring your feet. Always look in your shoes before you put them on to be sure there are no objects inside.  Do not cross your legs. This may decrease the blood flow to your feet.  If you find a minor scrape,  cut, or break in the skin on your feet, keep it and the skin around it clean and dry. These areas may be cleansed with mild soap and water. Do not cleanse the area with peroxide, alcohol, or iodine.  When you remove an adhesive bandage, be sure not to damage the skin around it.  If you have a wound, look at it several times a day to make sure it is healing.  Do not use heating pads or hot water bottles. They may burn your skin. If you have lost feeling in your feet or legs, you may not know it is happening until it is too late.  Make sure your health care provider performs a complete foot exam at least annually or more often if you have foot problems. Report any cuts, sores, or bruises to your health care provider immediately. SEEK MEDICAL CARE IF:   You have an injury that is not healing.  You have cuts or breaks in the skin.  You have an ingrown nail.  You notice redness on your legs or feet.  You feel burning or tingling in your legs or feet.  You have pain or cramps in your legs and feet.  Your legs or feet are numb.  Your feet always feel cold. SEEK IMMEDIATE MEDICAL CARE IF:   There is increasing redness,   swelling, or pain in or around a wound.  There is a red line that goes up your leg.  Pus is coming from a wound.  You develop a fever or as directed by your health care provider.  You notice a bad smell coming from an ulcer or wound.   This information is not intended to replace advice given to you by your health care provider. Make sure you discuss any questions you have with your health care provider.   Document Released: 11/15/2000 Document Revised: 07/21/2013 Document Reviewed: 04/27/2013 Elsevier Interactive Patient Education 2016 [employer]  

## 2016-06-06 NOTE — Progress Notes (Signed)
Patient ID: Eric Jones, male   DOB: [birth year], 75 y.o.   MRN: [id number]  Subjective: This patient presents for scheduled visit complaining of elongated and thickened toenails which are on call for walking wearing shoes and requests toenail debridement  Objective: Orientated 3 No skin lesions bilaterally The toenails are incurvated, hypertrophic, discolored, deformed, and tender to direct palpation 6-10  Assessment: Symptomatic onychomycoses 6-10 Type II diabetic with peripheral neuropathy  Plan: Debridement of toenails 6-10 mechanically and electrically without any bleeding  Reappoint 3 months

## 2016-09-04 ENCOUNTER — Encounter: Payer: Self-pay | Admitting: Podiatry

## 2016-09-04 ENCOUNTER — Ambulatory Visit (INDEPENDENT_AMBULATORY_CARE_PROVIDER_SITE_OTHER): Payer: Medicare Other | Admitting: Podiatry

## 2016-09-04 VITALS — BP 186/95 | HR 80 | Resp 18

## 2016-09-04 DIAGNOSIS — B351 Tinea unguium: Secondary | ICD-10-CM | POA: Diagnosis not present

## 2016-09-04 DIAGNOSIS — M79676 Pain in unspecified toe(s): Secondary | ICD-10-CM | POA: Diagnosis not present

## 2016-09-04 NOTE — Progress Notes (Signed)
Patient ID: Eric Jones, male   DOB: 01/23/1941, 75 y.o.   MRN: [id number]  Subjective: This patient presents for scheduled visit complaining of elongated and thickened toenails which are on call for walking wearing shoes and requests toenail debridement  Objective: Orientated 3 He and PT pulses 2/4 bilaterally Capillary reflex delayed bilaterally Sensation to 10 g monofilament wire intact 5/5 bilaterally Vibratory sensation reactive bilaterally Ankle reflex equal and reactive bilaterally Hammertoe second bilaterally Prominent posterior heel spur bilaterally No skin lesions bilaterally The toenails are incurvated, hypertrophic, discolored, deformed, and tender to direct palpation 6-10  Assessment: Symptomatic onychomycoses 6-10 Type II diabetic with protective sensation intact Vascular status adequate  Plan: Debridement of toenails 6-10 mechanically and electrically without any bleeding  Reappoint 3 months

## 2016-09-04 NOTE — Patient Instructions (Signed)
Diabetes and Foot Care Diabetes may cause you to have problems because of poor blood supply (circulation) to your feet and legs. This may cause the skin on your feet to become thinner, break easier, and heal more slowly. Your skin may become dry, and the skin may peel and crack. You may also have nerve damage in your legs and feet causing decreased feeling in them. You may not notice minor injuries to your feet that could lead to infections or more serious problems. Taking care of your feet is one of the most important things you can do for yourself.  HOME CARE INSTRUCTIONS  Wear shoes at all times, even in the house. Do not go barefoot. Bare feet are easily injured.  Check your feet daily for blisters, cuts, and redness. If you cannot see the bottom of your feet, use a mirror or ask someone for help.  Wash your feet with warm water (do not use hot water) and mild soap. Then pat your feet and the areas between your toes until they are completely dry. Do not soak your feet as this can dry your skin.  Apply a moisturizing lotion or petroleum jelly (that does not contain alcohol and is unscented) to the skin on your feet and to dry, brittle toenails. Do not apply lotion between your toes.  Trim your toenails straight across. Do not dig under them or around the cuticle. File the edges of your nails with an emery board or nail file.  Do not cut corns or calluses or try to remove them with medicine.  Wear clean socks or stockings every day. Make sure they are not too tight. Do not wear knee-high stockings since they may decrease blood flow to your legs.  Wear shoes that fit properly and have enough cushioning. To break in new shoes, wear them for just a few hours a day. This prevents you from injuring your feet. Always look in your shoes before you put them on to be sure there are no objects inside.  Do not cross your legs. This may decrease the blood flow to your feet.  If you find a minor scrape,  cut, or break in the skin on your feet, keep it and the skin around it clean and dry. These areas may be cleansed with mild soap and water. Do not cleanse the area with peroxide, alcohol, or iodine.  When you remove an adhesive bandage, be sure not to damage the skin around it.  If you have a wound, look at it several times a day to make sure it is healing.  Do not use heating pads or hot water bottles. They may burn your skin. If you have lost feeling in your feet or legs, you may not know it is happening until it is too late.  Make sure your health care provider performs a complete foot exam at least annually or more often if you have foot problems. Report any cuts, sores, or bruises to your health care provider immediately. SEEK MEDICAL CARE IF:   You have an injury that is not healing.  You have cuts or breaks in the skin.  You have an ingrown nail.  You notice redness on your legs or feet.  You feel burning or tingling in your legs or feet.  You have pain or cramps in your legs and feet.  Your legs or feet are numb.  Your feet always feel cold. SEEK IMMEDIATE MEDICAL CARE IF:   There is increasing redness,   swelling, or pain in or around a wound.  There is a red line that goes up your leg.  Pus is coming from a wound.  You develop a fever or as directed by your health care provider.  You notice a bad smell coming from an ulcer or wound.   This information is not intended to replace advice given to you by your health care provider. Make sure you discuss any questions you have with your health care provider.   Document Released: 11/15/2000 Document Revised: 07/21/2013 Document Reviewed: 04/27/2013 Elsevier Interactive Patient Education 2016 [employer]  

## 2016-12-04 ENCOUNTER — Ambulatory Visit: Payer: Medicare Other | Admitting: Podiatry

## 2016-12-10 ENCOUNTER — Ambulatory Visit (INDEPENDENT_AMBULATORY_CARE_PROVIDER_SITE_OTHER): Payer: Medicare Other | Admitting: Podiatry

## 2016-12-10 ENCOUNTER — Encounter: Payer: Self-pay | Admitting: Podiatry

## 2016-12-10 VITALS — BP 183/101 | HR 80 | Resp 16

## 2016-12-10 DIAGNOSIS — B351 Tinea unguium: Secondary | ICD-10-CM | POA: Diagnosis not present

## 2016-12-10 DIAGNOSIS — M79676 Pain in unspecified toe(s): Secondary | ICD-10-CM | POA: Diagnosis not present

## 2016-12-10 NOTE — Progress Notes (Signed)
Patient ID: Eric Jones, male   DOB: 02/12/1941, 76 y.o.   MRN: [id number]    Subjective: This patient presents for scheduled visit complaining of elongated and thickened toenails which are on call for walking wearing shoes and requests toenail debridement  Objective: Orientated 3 He and PT pulses 2/4 bilaterally Capillary reflex delayed bilaterally Sensation to 10 g monofilament wire intact 5/5 bilaterally Vibratory sensation reactive bilaterally Ankle reflex equal and reactive bilaterally Hammertoe second bilaterally Prominent posterior heel spur bilaterally No skin lesions bilaterally The toenails are incurvated, hypertrophic, discolored, deformed, and tender to direct palpation 6-10  Assessment: Symptomatic onychomycoses 6-10 Type II diabetic with protective sensation intact Vascular status adequate  Plan: Debridement of toenails 6-10 mechanically and electrically without any bleeding  Reappoint 3 months

## 2016-12-10 NOTE — Patient Instructions (Signed)

## 2017-03-11 ENCOUNTER — Ambulatory Visit: Payer: Medicare Other | Admitting: Podiatry

## 2017-04-09 ENCOUNTER — Encounter: Payer: Self-pay | Admitting: Podiatry

## 2017-04-09 ENCOUNTER — Ambulatory Visit (INDEPENDENT_AMBULATORY_CARE_PROVIDER_SITE_OTHER): Payer: Medicare Other | Admitting: Podiatry

## 2017-04-09 DIAGNOSIS — E1140 Type 2 diabetes mellitus with diabetic neuropathy, unspecified: Secondary | ICD-10-CM | POA: Diagnosis not present

## 2017-04-09 DIAGNOSIS — B351 Tinea unguium: Secondary | ICD-10-CM | POA: Diagnosis not present

## 2017-04-09 DIAGNOSIS — M79676 Pain in unspecified toe(s): Secondary | ICD-10-CM | POA: Diagnosis not present

## 2017-04-09 NOTE — Patient Instructions (Signed)

## 2017-04-09 NOTE — Progress Notes (Signed)
Patient ID: Eric Jones, male   DOB: 1941/02/25, 76 y.o.   MRN: [id number]    Subjective: This patient presents for scheduled visit complaining of elongated and thickened toenails which are on call for walking wearing shoes and requests toenail debridement  Objective: Orientated 3 He and PT pulses 2/4 bilaterally Capillary reflex delayed bilaterally Sensation to 10 g monofilament wire intact 5/5 bilaterally Vibratory sensation reactive bilaterally Ankle reflex equal and reactive bilaterally Hammertoe second bilaterally HAV left Prominent posterior heel spur bilaterally No skin lesions bilaterally The toenails are incurvated, hypertrophic, discolored, deformed, and tender to direct palpation 6-10 Atrophic skin with absent hair growth bilaterally  Assessment: Symptomatic onychomycoses 6-10 Type II diabetic with protective sensation intact Vascular status adequate  Plan: Debridement of toenails 6-10 mechanically and electrically without any bleeding  Reappoint 3 months

## 2017-07-15 ENCOUNTER — Ambulatory Visit (INDEPENDENT_AMBULATORY_CARE_PROVIDER_SITE_OTHER): Payer: Medicare Other | Admitting: Podiatry

## 2017-07-15 ENCOUNTER — Encounter: Payer: Self-pay | Admitting: Podiatry

## 2017-07-15 DIAGNOSIS — B351 Tinea unguium: Secondary | ICD-10-CM

## 2017-07-15 DIAGNOSIS — M79676 Pain in unspecified toe(s): Secondary | ICD-10-CM

## 2017-07-15 NOTE — Progress Notes (Signed)
Patient ID: Eric Jones, male   DOB: 01-31-1941, 76 y.o.   MRN: [id number]    Subjective: This patient presents for scheduled visit complaining of elongated and thickened toenails which are on call for walking wearing shoes and requests toenail debridement  Objective: Orientated 3 He and PT pulses 2/4 bilaterally Capillary reflex delayed bilaterally Sensation to 10 g monofilament wire intact 5/5 bilaterally Vibratory sensation reactive bilaterally Ankle reflex equal and reactive bilaterally Hammertoe second bilaterally HAV left Prominent posterior heel spur bilaterally No skin lesions bilaterally The toenails are incurvated, hypertrophic, discolored, deformed, and tender to direct palpation 6-10 Atrophic skin with absent hair growth bilaterally  Assessment: Symptomatic onychomycoses 6-10 Type II diabetic with protective sensation intact Vascular status adequate  Plan: Debridement of toenails 6-10 mechanically and electrically without any bleeding  Reappoint 3 months

## 2017-07-15 NOTE — Patient Instructions (Signed)

## 2017-10-20 ENCOUNTER — Ambulatory Visit (INDEPENDENT_AMBULATORY_CARE_PROVIDER_SITE_OTHER): Payer: Medicare Other | Admitting: Podiatry

## 2017-10-20 ENCOUNTER — Encounter: Payer: Self-pay | Admitting: Podiatry

## 2017-10-20 DIAGNOSIS — B351 Tinea unguium: Secondary | ICD-10-CM | POA: Diagnosis not present

## 2017-10-20 DIAGNOSIS — E1140 Type 2 diabetes mellitus with diabetic neuropathy, unspecified: Secondary | ICD-10-CM

## 2017-10-20 DIAGNOSIS — M79676 Pain in unspecified toe(s): Secondary | ICD-10-CM

## 2017-10-20 NOTE — Patient Instructions (Signed)
Remove the Band-Aid and a fourth left toe 1-3 days and apply topical antibiotic ointment and Band-Aid until a scab forms  Diabetes and Foot Care Diabetes may cause you to have problems because of poor blood supply (circulation) to your feet and legs. This may cause the skin on your feet to become thinner, break easier, and heal more slowly. Your skin may become dry, and the skin may peel and crack. You may also have nerve damage in your legs and feet causing decreased feeling in them. You may not notice minor injuries to your feet that could lead to infections or more serious problems. Taking care of your feet is one of the most important things you can do for yourself. Follow these instructions at home:  Wear shoes at all times, even in the house. Do not go barefoot. Bare feet are easily injured.  Check your feet daily for blisters, cuts, and redness. If you cannot see the bottom of your feet, use a mirror or ask someone for help.  Wash your feet with warm water (do not use hot water) and mild soap. Then pat your feet and the areas between your toes until they are completely dry. Do not soak your feet as this can dry your skin.  Apply a moisturizing lotion or petroleum jelly (that does not contain alcohol and is unscented) to the skin on your feet and to dry, brittle toenails. Do not apply lotion between your toes.  Trim your toenails straight across. Do not dig under them or around the cuticle. File the edges of your nails with an emery board or nail file.  Do not cut corns or calluses or try to remove them with medicine.  Wear clean socks or stockings every day. Make sure they are not too tight. Do not wear knee-high stockings since they may decrease blood flow to your legs.  Wear shoes that fit properly and have enough cushioning. To break in new shoes, wear them for just a few hours a day. This prevents you from injuring your feet. Always look in your shoes before you put them on to be sure  there are no objects inside.  Do not cross your legs. This may decrease the blood flow to your feet.  If you find a minor scrape, cut, or break in the skin on your feet, keep it and the skin around it clean and dry. These areas may be cleansed with mild soap and water. Do not cleanse the area with peroxide, alcohol, or iodine.  When you remove an adhesive bandage, be sure not to damage the skin around it.  If you have a wound, look at it several times a day to make sure it is healing.  Do not use heating pads or hot water bottles. They may burn your skin. If you have lost feeling in your feet or legs, you may not know it is happening until it is too late.  Make sure your health care provider performs a complete foot exam at least annually or more often if you have foot problems. Report any cuts, sores, or bruises to your health care provider immediately. Contact a health care provider if:  You have an injury that is not healing.  You have cuts or breaks in the skin.  You have an ingrown nail.  You notice redness on your legs or feet.  You feel burning or tingling in your legs or feet.  You have pain or cramps in your legs and feet.  Your legs or feet are numb.  Your feet always feel cold. Get help right away if:  There is increasing redness, swelling, or pain in or around a wound.  There is a red line that goes up your leg.  Pus is coming from a wound.  You develop a fever or as directed by your health care provider.  You notice a bad smell coming from an ulcer or wound. This information is not intended to replace advice given to you by your health care provider. Make sure you discuss any questions you have with your health care provider. Document Released: 11/15/2000 Document Revised: 04/25/2016 Document Reviewed: 04/27/2013 Elsevier Interactive Patient Education  2017 [employer].

## 2017-10-20 NOTE — Progress Notes (Signed)
Patient ID: Eric Jones, male   DOB: [birth year], 76 y.o.   MRN: [id number]   Subjective: This patient presents for scheduled visit complaining of elongated and thickened toenails which are on call for walking wearing shoes and requests toenail debridement  Objective: Orientated 3 He and PT pulses 2/4 bilaterally Capillary reflex delayed bilaterally Sensation to 10 g monofilament wire intact 5/5 bilaterally Vibratory sensation reactive bilaterally Ankle reflex equal and reactive bilaterally Hammertoe second bilaterally HAV left Prominent posterior heel spur bilaterally No skin lesions bilaterally The toenails are incurvated, hypertrophic, discolored, deformed, and tender to direct palpation 6-10 Atrophic skin with absent hair growth bilaterally  Assessment: Symptomatic onychomycoses 6-10 Type II diabetic with protective sensation intact Vascular status adequate  Plan: Debridement of toenails 6-10 mechanically and electrically with slight bleeding distal fourth left toe, treated with topical antibiotic ointment and Band-Aid. Patient instructed removed Band-Aid 1-3 days and apply topical antibiotic ointment and Band-Aid until a scab forms  Reappoint 3 months

## 2018-01-07 ENCOUNTER — Encounter: Payer: Self-pay | Admitting: Podiatry

## 2018-01-07 ENCOUNTER — Ambulatory Visit (INDEPENDENT_AMBULATORY_CARE_PROVIDER_SITE_OTHER): Payer: Medicare Other | Admitting: Podiatry

## 2018-01-07 DIAGNOSIS — M79675 Pain in left toe(s): Secondary | ICD-10-CM

## 2018-01-07 DIAGNOSIS — B351 Tinea unguium: Secondary | ICD-10-CM | POA: Diagnosis not present

## 2018-01-07 DIAGNOSIS — M79674 Pain in right toe(s): Secondary | ICD-10-CM

## 2018-01-07 DIAGNOSIS — E1140 Type 2 diabetes mellitus with diabetic neuropathy, unspecified: Secondary | ICD-10-CM

## 2018-01-07 NOTE — Progress Notes (Signed)
Complaint:  Visit Type: Patient returns to my office for continued preventative foot care services. Complaint: Patient states" my nails have grown long and thick and become painful to walk and wear shoes" Patient has been diagnosed with DM with no foot complications. The patient presents for preventative foot care services. No changes to ROS  Podiatric Exam: Vascular: dorsalis pedis and posterior tibial pulses are palpable bilateral. Capillary return is immediate. Temperature gradient is WNL. Skin turgor WNL  Sensorium: Normal Semmes Weinstein monofilament test. Normal tactile sensation bilaterally. Nail Exam: Pt has thick disfigured discolored nails with subungual debris noted bilateral entire nail hallux through fifth toenails Ulcer Exam: There is no evidence of ulcer or pre-ulcerative changes or infection. Orthopedic Exam: Muscle tone and strength are WNL. No limitations in general ROM. No crepitus or effusions noted. Foot type and digits show no abnormalities. Bony prominences are unremarkable. Skin: No Porokeratosis. No infection or ulcers  Diagnosis:  Onychomycosis, , Pain in right toe, pain in left toes  Treatment & Plan Procedures and Treatment: Consent by patient was obtained for treatment procedures.   Debridement of mycotic and hypertrophic toenails, 1 through 5 bilateral and clearing of subungual debris. No ulceration, no infection noted.  Return Visit-Office Procedure: Patient instructed to return to the office for a follow up visit 3 months for continued evaluation and treatment.    Ariyona Eid DPM 

## 2018-01-19 ENCOUNTER — Ambulatory Visit: Payer: Medicare Other | Admitting: Podiatry

## 2018-04-10 ENCOUNTER — Encounter: Payer: Self-pay | Admitting: Podiatry

## 2018-04-10 ENCOUNTER — Ambulatory Visit (INDEPENDENT_AMBULATORY_CARE_PROVIDER_SITE_OTHER): Payer: Medicare Other | Admitting: Podiatry

## 2018-04-10 DIAGNOSIS — M79674 Pain in right toe(s): Secondary | ICD-10-CM

## 2018-04-10 DIAGNOSIS — M79675 Pain in left toe(s): Secondary | ICD-10-CM

## 2018-04-10 DIAGNOSIS — B351 Tinea unguium: Secondary | ICD-10-CM | POA: Diagnosis not present

## 2018-04-10 DIAGNOSIS — E1140 Type 2 diabetes mellitus with diabetic neuropathy, unspecified: Secondary | ICD-10-CM

## 2018-04-10 NOTE — Progress Notes (Signed)
Complaint:  Visit Type: Patient returns to my office for continued preventative foot care services. Complaint: Patient states" my nails have grown long and thick and become painful to walk and wear shoes" Patient has been diagnosed with DM with no foot complications. The patient presents for preventative foot care services. No changes to ROS  Podiatric Exam: Vascular: dorsalis pedis and posterior tibial pulses are palpable bilateral. Capillary return is immediate. Temperature gradient is WNL. Skin turgor WNL  Sensorium: Normal Semmes Weinstein monofilament test. Normal tactile sensation bilaterally. Nail Exam: Pt has thick disfigured discolored nails with subungual debris noted bilateral entire nail hallux through fifth toenails Ulcer Exam: There is no evidence of ulcer or pre-ulcerative changes or infection. Orthopedic Exam: Muscle tone and strength are WNL. No limitations in general ROM. No crepitus or effusions noted. Foot type and digits show no abnormalities. Bony prominences are unremarkable. Skin: No Porokeratosis. No infection or ulcers  Diagnosis:  Onychomycosis, , Pain in right toe, pain in left toes  Treatment & Plan Procedures and Treatment: Consent by patient was obtained for treatment procedures.   Debridement of mycotic and hypertrophic toenails, 1 through 5 bilateral and clearing of subungual debris. No ulceration, no infection noted.  Return Visit-Office Procedure: Patient instructed to return to the office for a follow up visit 3 months for continued evaluation and treatment.    Kennon Encinas DPM 

## 2018-06-22 ENCOUNTER — Other Ambulatory Visit: Payer: Self-pay | Admitting: Family Medicine

## 2018-06-22 ENCOUNTER — Ambulatory Visit
Admission: RE | Admit: 2018-06-22 | Discharge: 2018-06-22 | Disposition: A | Discharge status: Home / Self Care | Payer: Medicare Other | Source: Ambulatory Visit | Attending: Family Medicine | Admitting: Family Medicine

## 2018-06-22 DIAGNOSIS — M25461 Effusion, right knee: Secondary | ICD-10-CM

## 2018-07-02 ENCOUNTER — Ambulatory Visit (INDEPENDENT_AMBULATORY_CARE_PROVIDER_SITE_OTHER): Payer: Medicare Other | Admitting: Orthopaedic Surgery

## 2018-07-02 ENCOUNTER — Ambulatory Visit (INDEPENDENT_AMBULATORY_CARE_PROVIDER_SITE_OTHER): Payer: Self-pay

## 2018-07-02 ENCOUNTER — Encounter (INDEPENDENT_AMBULATORY_CARE_PROVIDER_SITE_OTHER): Payer: Self-pay | Admitting: Orthopaedic Surgery

## 2018-07-02 DIAGNOSIS — M25561 Pain in right knee: Secondary | ICD-10-CM | POA: Diagnosis not present

## 2018-07-02 DIAGNOSIS — M1711 Unilateral primary osteoarthritis, right knee: Secondary | ICD-10-CM

## 2018-07-02 MED ORDER — LIDOCAINE HCL 1 % IJ SOLN
3.0000 mL | INTRAMUSCULAR | Status: AC | PRN
  Administered 2018-07-02: 3 mL

## 2018-07-02 MED ORDER — METHYLPREDNISOLONE ACETATE 40 MG/ML IJ SUSP
40.0000 mg | INTRAMUSCULAR | Status: AC | PRN
  Administered 2018-07-02: 40 mg via INTRA_ARTICULAR

## 2018-07-02 NOTE — Progress Notes (Signed)
Office Visit Note   Patient: Eric Jones           Date of Birth: Jul 31, 1941           MRN: [id number] Visit Date: 07/02/2018              Requested by: Shirline Frees, MD [place],  [id number] PCP: Shirline Frees, MD   Assessment & Plan: Visit Diagnoses:  1. Acute pain of right knee   2. Unilateral primary osteoarthritis, right knee     Plan: I shared with him his x-ray findings.  I do feel that something is going on with his knee since this is been an acute flareup of pain.  I did offer steroid injection in his knee and counseled him about the risk and benefits of these injections and how they can affect his blood glucose.  He tolerated the injection the right knee well.  We will reevaluate his left knee at his next visit as well to determine whether or not that would need an injection.  I would not inject both at once due to him by a diabetic.  I do feel he benefit from trying a cane in his opposite hand.  I showed him quad strengthening exercises I want him to perform we put him in a knee brace for his right knee for support.  All questions concerns were answered and addressed.  We will reevaluate him in 3 weeks but no x-rays are needed.  Follow-Up Instructions: Return in about 3 weeks (around 07/23/2018).   Orders:  Orders Placed This Encounter  Procedures  . Large Joint Inj  . XR Knee 1-2 Views Right   No orders of the defined types were placed in this encounter.     Procedures: Large Joint Inj: R knee on 07/02/2018 3:56 PM Indications: diagnostic evaluation and pain Details: 22 G 1.5 in needle, superolateral approach  Arthrogram: No  Medications: 3 mL lidocaine 1 %; 40 mg methylPREDNISolone acetate 40 MG/ML Outcome: tolerated well, no immediate complications Procedure, treatment alternatives, risks and benefits explained, specific risks discussed. Consent was given by the patient. Immediately prior to procedure a time out was  called to verify the correct patient, procedure, equipment, support staff and site/side marked as required. Patient was prepped and draped in the usual sterile fashion.       Clinical Data: No additional findings.   Subjective: Chief Complaint  Patient presents with  . Right Knee - Pain  The patient comes in today to be seen about right knee pain.  He is someone that actually took care of his wife years ago and she has since passed away.  His right knee is been hurting him for about a month and a half now.  He is 77 years old and denies any type of injury but he did play football for years.  He is having problems getting up out of a chair and had some calf pain but ultrasound showed no DVT.  He says it is painful mostly with walking he has had some off-and-on swelling.  He says is really hard to get up on his knees and get out of the chair.  He is a diabetic and reports decent blood glucose control.  HPI  Review of Systems He currently denies any headache, chest pain, shortness of breath, fever, chills, nausea, vomiting.  Objective: Vital Signs: There were no vitals taken for this visit.  Physical Exam He is alert and  oriented x3 and in no acute distress Ortho Exam Examination of his knees show that he does have a hard time getting up on the exam table I do feel that he may be a fall risk.  Both right and left knees do hurt along the medial joint line of the patella with tendon areas.  Both knees feel ligamentously stable with good range of motion but definitely have patellofemoral crepitation worse on the right than left. Specialty Comments:  No specialty comments available.  Imaging: Xr Knee 1-2 Views Right  Result Date: 07/02/2018 An AP and lateral of the right knee show moderate osteoarthritic changes.  There are para-articular osteophytes in all 3 compartments.  The joint space still is not completely closed down with decent joint space remaining.    PMFS History: There are  no active problems to display for this patient.  Past Medical History:  Diagnosis Date  . Cancer ([place])   . Diabetes mellitus without complication ([place])   . Hypertension     History reviewed. No pertinent family history.  History reviewed. No pertinent surgical history. Social History   Occupational History  . Not on file  Tobacco Use  . Smoking status: Never Smoker  . Smokeless tobacco: Never Used  Substance and Sexual Activity  . Alcohol use: No  . Drug use: No  . Sexual activity: Not on file

## 2018-07-10 ENCOUNTER — Ambulatory Visit (INDEPENDENT_AMBULATORY_CARE_PROVIDER_SITE_OTHER): Payer: Medicare Other | Admitting: Podiatry

## 2018-07-10 ENCOUNTER — Encounter: Payer: Self-pay | Admitting: Podiatry

## 2018-07-10 DIAGNOSIS — M79674 Pain in right toe(s): Secondary | ICD-10-CM

## 2018-07-10 DIAGNOSIS — M79675 Pain in left toe(s): Secondary | ICD-10-CM | POA: Diagnosis not present

## 2018-07-10 DIAGNOSIS — B351 Tinea unguium: Secondary | ICD-10-CM

## 2018-07-10 DIAGNOSIS — E1140 Type 2 diabetes mellitus with diabetic neuropathy, unspecified: Secondary | ICD-10-CM

## 2018-07-10 NOTE — Progress Notes (Signed)
Complaint:  Visit Type: Patient returns to my office for continued preventative foot care services. Complaint: Patient states" my nails have grown long and thick and become painful to walk and wear shoes" Patient has been diagnosed with DM with no foot complications. The patient presents for preventative foot care services. No changes to ROS  Podiatric Exam: Vascular: dorsalis pedis and posterior tibial pulses are palpable bilateral. Capillary return is immediate. Temperature gradient is WNL. Skin turgor WNL  Sensorium: Normal Semmes Weinstein monofilament test. Normal tactile sensation bilaterally. Nail Exam: Pt has thick disfigured discolored nails with subungual debris noted bilateral entire nail hallux through fifth toenails Ulcer Exam: There is no evidence of ulcer or pre-ulcerative changes or infection. Orthopedic Exam: Muscle tone and strength are WNL. No limitations in general ROM. No crepitus or effusions noted. Foot type and digits show no abnormalities. Bony prominences are unremarkable. Skin: No Porokeratosis. No infection or ulcers  Diagnosis:  Onychomycosis, , Pain in right toe, pain in left toes  Treatment & Plan Procedures and Treatment: Consent by patient was obtained for treatment procedures.   Debridement of mycotic and hypertrophic toenails, 1 through 5 bilateral and clearing of subungual debris. No ulceration, no infection noted.  Return Visit-Office Procedure: Patient instructed to return to the office for a follow up visit 3 months for continued evaluation and treatment.    Clement Deneault DPM 

## 2018-07-23 ENCOUNTER — Ambulatory Visit (INDEPENDENT_AMBULATORY_CARE_PROVIDER_SITE_OTHER): Payer: Medicare Other | Admitting: Orthopaedic Surgery

## 2018-07-23 ENCOUNTER — Encounter (INDEPENDENT_AMBULATORY_CARE_PROVIDER_SITE_OTHER): Payer: Self-pay | Admitting: Orthopaedic Surgery

## 2018-07-23 DIAGNOSIS — M25561 Pain in right knee: Secondary | ICD-10-CM

## 2018-07-23 DIAGNOSIS — M1711 Unilateral primary osteoarthritis, right knee: Secondary | ICD-10-CM

## 2018-07-23 DIAGNOSIS — M25562 Pain in left knee: Secondary | ICD-10-CM

## 2018-07-23 DIAGNOSIS — G8929 Other chronic pain: Secondary | ICD-10-CM

## 2018-07-23 MED ORDER — METHYLPREDNISOLONE ACETATE 40 MG/ML IJ SUSP
40.0000 mg | INTRAMUSCULAR | Status: AC | PRN
  Administered 2018-07-23: 40 mg via INTRA_ARTICULAR

## 2018-07-23 MED ORDER — LIDOCAINE HCL 1 % IJ SOLN
3.0000 mL | INTRAMUSCULAR | Status: AC | PRN
  Administered 2018-07-23: 3 mL

## 2018-07-23 NOTE — Progress Notes (Signed)
Office Visit Note   Patient: Eric Jones           Date of Birth: Jul 17, 1941           MRN: [id number] Visit Date: 07/23/2018              Requested by: Shirline Frees, MD [place], [place] [address] PCP: Shirline Frees, MD   Assessment & Plan: Visit Diagnoses:  1. Acute pain of right knee   2. Unilateral primary osteoarthritis, right knee   3. Chronic pain of left knee     Plan: Per his wishes I did provide a steroid injection in his left knee.  He still can work on weight loss and quad strengthening exercises.  Recommended over-the-counter knee sleeves to consider as well.  All questions concerns were answered and addressed.  Follow-up can be as needed at this point.  I did offer physical therapy formally as an outpatient he declined.  Follow-Up Instructions: Return if symptoms worsen or fail to improve.   Orders:  No orders of the defined types were placed in this encounter.  No orders of the defined types were placed in this encounter.     Procedures: Large Joint Inj: L knee on 07/23/2018 2:12 PM Indications: diagnostic evaluation and pain Details: 22 G 1.5 in needle, superolateral approach  Arthrogram: No  Medications: 3 mL lidocaine 1 %; 40 mg methylPREDNISolone acetate 40 MG/ML Outcome: tolerated well, no immediate complications Procedure, treatment alternatives, risks and benefits explained, specific risks discussed. Consent was given by the patient. Immediately prior to procedure a time out was called to verify the correct patient, procedure, equipment, support staff and site/side marked as required. Patient was prepped and draped in the usual sterile fashion.       Clinical Data: No additional findings.   Subjective: Chief Complaint  Patient presents with  . Right Knee - Follow-up  The patient is here for follow-up after having an injection in his right knee with a steroid previously that knee is tremendously better.   He has known osteoarthritis in the right knee.  He is 77 years old and a diabetic and this did not greatly affect his blood glucose but did cause a headache.  He says now his left knee needs to be checked out.  Is been locking catching and giving way.  He does hurt a little bit.  He would like me to consider steroid injection his left knee today.  Again he states that his right knee is doing wonderful.  HPI  Review of Systems He currently denies any headache or chest pain.  Denies any shortness of breath, denies any fever, chills, nausea, vomiting.  Also  Objective: Vital Signs: There were no vitals taken for this visit.  Physical Exam He is alert and oriented x3 and in no acute distress Ortho Exam On examination his right knee appears normal.  His left knee has just some slight joint line tenderness with no effusion.  Most of his pain is over the lateral IT band and the medial gastroc and hamstring area. Specialty Comments:  No specialty comments available.  Imaging: No results found. Review of x-rays from his last visit did show an AP standing of the left knee that showed a well-maintained joint line on that view.  PMFS History: There are no active problems to display for this patient.  Past Medical History:  Diagnosis Date  . Cancer ([place])   . Diabetes mellitus without  complication ([place])   . Hypertension     History reviewed. No pertinent family history.  History reviewed. No pertinent surgical history. Social History   Occupational History  . Not on file  Tobacco Use  . Smoking status: Never Smoker  . Smokeless tobacco: Never Used  Substance and Sexual Activity  . Alcohol use: No  . Drug use: No  . Sexual activity: Not on file

## 2018-08-31 ENCOUNTER — Encounter (INDEPENDENT_AMBULATORY_CARE_PROVIDER_SITE_OTHER): Payer: Self-pay | Admitting: Physician Assistant

## 2018-08-31 ENCOUNTER — Ambulatory Visit (INDEPENDENT_AMBULATORY_CARE_PROVIDER_SITE_OTHER): Payer: Medicare Other | Admitting: Physician Assistant

## 2018-08-31 DIAGNOSIS — M25562 Pain in left knee: Secondary | ICD-10-CM

## 2018-08-31 NOTE — Progress Notes (Signed)
HPI: [name] returns today due to left knee pain.  He was last seen on 8/22 by Dr. Ninfa Jones and given a cortisone injection in his left knee.  He states that he continues to have pain in the knee and giving way sensation of the knee is requesting a repeat injection in the knee.  He states that he feels very tight.  No known injury.  Radiographs AP of his left knee show the need to be well preserved.  Physical exam: General: Well-developed well-nourished male in no acute distress.  Mood and affect appropriate. Left knee tenderness along the medial joint line.  No abnormal warmth no erythema.  Positive effusion.  No instability valgus varus stressing.  He has near full extension flexes to at least 90 degrees.  Impression: Left knee effusion and pain with mechanical symptoms  Plan: Left knee was prepped with Betadine and ethyl chloride and incised skin and then 72 cc of yellow synovial-like fluid was aspirated patient tolerated well.  Send him for an MRI of  his left knee to rule out his left knee to rule out meniscal tear.  He will follow-up after the MRI to go over results and discuss further treatment.

## 2018-09-01 ENCOUNTER — Other Ambulatory Visit (INDEPENDENT_AMBULATORY_CARE_PROVIDER_SITE_OTHER): Payer: Self-pay

## 2018-09-01 DIAGNOSIS — M25562 Pain in left knee: Principal | ICD-10-CM

## 2018-09-01 DIAGNOSIS — G8929 Other chronic pain: Secondary | ICD-10-CM

## 2018-09-03 ENCOUNTER — Telehealth (INDEPENDENT_AMBULATORY_CARE_PROVIDER_SITE_OTHER): Payer: Self-pay | Admitting: Physician Assistant

## 2018-09-03 NOTE — Telephone Encounter (Signed)
Called patient left voicemail message to return call   Patient need appointment for MRI review with Artis Delay   [phone]

## 2018-09-05 ENCOUNTER — Ambulatory Visit
Admission: RE | Admit: 2018-09-05 | Discharge: 2018-09-05 | Disposition: A | Discharge status: Home / Self Care | Payer: Medicare Other | Source: Ambulatory Visit | Attending: Physician Assistant | Admitting: Physician Assistant

## 2018-09-05 DIAGNOSIS — G8929 Other chronic pain: Secondary | ICD-10-CM

## 2018-09-05 DIAGNOSIS — M25562 Pain in left knee: Principal | ICD-10-CM

## 2018-09-08 ENCOUNTER — Encounter (HOSPITAL_COMMUNITY): Payer: Self-pay

## 2018-09-08 ENCOUNTER — Emergency Department (HOSPITAL_COMMUNITY): Payer: Medicare Other

## 2018-09-08 ENCOUNTER — Emergency Department (HOSPITAL_COMMUNITY)
Admission: EM | Admit: 2018-09-08 | Discharge: 2018-09-08 | Disposition: A | Discharge status: Home / Self Care | Payer: Medicare Other | Attending: Emergency Medicine | Admitting: Emergency Medicine

## 2018-09-08 DIAGNOSIS — Z7982 Long term (current) use of aspirin: Secondary | ICD-10-CM | POA: Insufficient documentation

## 2018-09-08 DIAGNOSIS — W19XXXA Unspecified fall, initial encounter: Secondary | ICD-10-CM

## 2018-09-08 DIAGNOSIS — Y999 Unspecified external cause status: Secondary | ICD-10-CM | POA: Diagnosis not present

## 2018-09-08 DIAGNOSIS — X58XXXA Exposure to other specified factors, initial encounter: Secondary | ICD-10-CM | POA: Diagnosis not present

## 2018-09-08 DIAGNOSIS — E119 Type 2 diabetes mellitus without complications: Secondary | ICD-10-CM | POA: Diagnosis not present

## 2018-09-08 DIAGNOSIS — S20229A Contusion of unspecified back wall of thorax, initial encounter: Secondary | ICD-10-CM | POA: Diagnosis present

## 2018-09-08 DIAGNOSIS — Y939 Activity, unspecified: Secondary | ICD-10-CM | POA: Insufficient documentation

## 2018-09-08 DIAGNOSIS — Y929 Unspecified place or not applicable: Secondary | ICD-10-CM | POA: Insufficient documentation

## 2018-09-08 DIAGNOSIS — Z79899 Other long term (current) drug therapy: Secondary | ICD-10-CM | POA: Insufficient documentation

## 2018-09-08 DIAGNOSIS — Z794 Long term (current) use of insulin: Secondary | ICD-10-CM | POA: Insufficient documentation

## 2018-09-08 DIAGNOSIS — I10 Essential (primary) hypertension: Secondary | ICD-10-CM | POA: Insufficient documentation

## 2018-09-08 MED ORDER — FENTANYL CITRATE (PF) 100 MCG/2ML IJ SOLN
50.0000 ug | Freq: Once | INTRAMUSCULAR | Status: AC
  Administered 2018-09-08: 50 ug via INTRAVENOUS
  Filled 2018-09-08: qty 2

## 2018-09-08 MED ORDER — ONDANSETRON HCL 4 MG/2ML IJ SOLN
4.0000 mg | Freq: Once | INTRAMUSCULAR | Status: AC
  Administered 2018-09-08: 4 mg via INTRAVENOUS
  Filled 2018-09-08: qty 2

## 2018-09-08 NOTE — Discharge Instructions (Addendum)
You may take Tylenol 1000 mg every 6 hours as needed for pain.  Your x-ray showed no fracture or dislocation.  If you develop worsening pain that is uncontrolled, difficulty breathing, bloody urine, numbness or weakness in your extremities, bowel or bladder incontinence or are unable to urinate, please return to the hospital.

## 2018-09-08 NOTE — ED Notes (Signed)
D/c reviewed with patient and family 

## 2018-09-08 NOTE — ED Provider Notes (Signed)
TIME SEEN: 6:02 AM  CHIEF COMPLAINT: Fall, back pain  HPI: Patient is a 77 year old male with history of hypertension, diabetes who presents to the emergency department after a fall that occurred just prior to arrival.  States he has been having problems with his left knee and is being followed by an orthopedist for this and is scheduled to have an MRI soon.  States he was getting into bed and turned in his left knee gave out and he went down to the ground.  Hit his mid thoracic region on the floor and scraped his left forearm.  Complaining of back pain.  No numbness, tingling or focal weakness.  No bowel or bladder incontinence.  States his last tetanus vaccination was within the last 5 years.  Denies head injury or loss of consciousness.  On aspirin but no other antiplatelet and no anticoagulation.  ROS: See HPI Constitutional: no fever  Eyes: no drainage  ENT: no runny nose   Cardiovascular:  no chest pain  Resp: no SOB  GI: no vomiting GU: no dysuria Integumentary: no rash  Allergy: no hives  Musculoskeletal: no leg swelling  Neurological: no slurred speech ROS otherwise negative  PAST MEDICAL HISTORY/PAST SURGICAL HISTORY:  Past Medical History:  Diagnosis Date  . Cancer ([place])   . Diabetes mellitus without complication ([place])   . Hypertension     MEDICATIONS:  Prior to Admission medications   Medication Sig Start Date End Date Taking? Authorizing Provider  aspirin 81 MG tablet Take 81 mg by mouth daily.    [provider]  benzonatate (TESSALON) 100 MG capsule Take 1 capsule (100 mg total) by mouth every 8 (eight) hours. 05/22/15   Dahlia Bailiff, PA-C  fluticasone (FLONASE) 50 MCG/ACT nasal spray Place 1 spray into both nostrils as needed for allergies or rhinitis.    [provider]  glimepiride (AMARYL) 4 MG tablet Take 4 mg by mouth daily with breakfast.    [provider]  Insulin Glargine (LANTUS SOLOSTAR) 100 UNIT/ML Solostar Pen Inject 25 Units  into the skin 2 (two) times daily.    [provider]  lisinopril-hydrochlorothiazide (PRINZIDE,ZESTORETIC) 20-12.5 MG per tablet Take 1 tablet by mouth daily.    [provider]  Naproxen Sod-Diphenhydramine (ALEVE PM) 220-25 MG TABS Take 2 tablets by mouth at bedtime as needed (sleep).    [provider]  pioglitazone (ACTOS) 30 MG tablet Take 30 mg by mouth daily.    [provider]  simvastatin (ZOCOR) 40 MG tablet Take 40 mg by mouth every other day.     [provider]    ALLERGIES:  No Known Allergies  SOCIAL HISTORY:  Social History   Tobacco Use  . Smoking status: Never Smoker  . Smokeless tobacco: Never Used  Substance Use Topics  . Alcohol use: No    FAMILY HISTORY: History reviewed. No pertinent family history.  EXAM: BP (!) 159/71 (BP Location: Left Arm)   Pulse 90   Temp 98.6 F (37 C) (Oral)   Resp 16   Ht 5\' 10"  (1.778 m)   Wt 123.4 kg   SpO2 97%   BMI 39.03 kg/m  CONSTITUTIONAL: Alert and oriented and responds appropriately to questions. Well-appearing; well-nourished; GCS 15 HEAD: Normocephalic; atraumatic EYES: Conjunctivae clear, PERRL, EOMI ENT: normal nose; no rhinorrhea; moist mucous membranes; pharynx without lesions noted; no dental injury; no septal hematoma NECK: Supple, no meningismus, no LAD; no midline spinal tenderness, step-off or deformity; trachea midline CARD: RRR;  S1 and S2 appreciated; no murmurs, no clicks, no rubs, no gallops RESP: Normal chest excursion without splinting or tachypnea; breath sounds clear and equal bilaterally; no wheezes, no rhonchi, no rales; no hypoxia or respiratory distress CHEST:  chest wall stable, no crepitus or ecchymosis or deformity, nontender to palpation; no flail chest ABD/GI: Normal bowel sounds; non-distended; soft, non-tender, no rebound, no guarding; no ecchymosis or other lesions noted PELVIS:  stable, nontender to palpation BACK:  The back appears normal  and is tender over the mid to lower thoracic spine without step-off or deformity EXT: Normal ROM in all joints; non-tender to palpation; no edema; normal capillary refill; no cyanosis, no bony tenderness or bony deformity of patient's extremities, no joint effusion, compartments are soft, extremities are warm and well-perfused, no ecchymosis, small superficial skin tear noted to the distal left dorsal forearm that is hemostatic SKIN: Normal color for age and race; warm NEURO: Moves all extremities equally; sensation to light touch intact diffusely, cranial nerves II through XII intact, normal speech PSYCH: The patient's mood and manner are appropriate. Grooming and personal hygiene are appropriate.  MEDICAL DECISION MAKING: Patient here with mechanical fall.  Will obtain x-rays of the thoracic spine.  Will give fentanyl, Zofran for symptomatic relief.  He is neurologically intact.  Other than small skin tear to the left arm, no other injury noted.  ED PROGRESS: Patient's x-ray showed no acute injury.  Pain improved with fentanyl.  He feels comfortable with plan to be discharged home and use Tylenol at home as needed for pain.  Discussed return precautions.   At this time, I do not feel there is any life-threatening condition present. I have reviewed and discussed all results (EKG, imaging, lab, urine as appropriate) and exam findings with patient/family. I have reviewed nursing notes and appropriate previous records.  I feel the patient is safe to be discharged home without further emergent workup and can continue workup as an outpatient as needed. Discussed usual and customary return precautions. Patient/family verbalize understanding and are comfortable with this plan.  Outpatient follow-up has been provided if needed. All questions have been answered.      Ward, Delice Bison, DO 09/08/18 0700

## 2018-09-08 NOTE — ED Triage Notes (Signed)
Pt was at home when he had a fall. Pt did not have LOC but states he has been having thoracic back pain since the fall. Pt is axox4 at this time.

## 2018-09-09 ENCOUNTER — Encounter (INDEPENDENT_AMBULATORY_CARE_PROVIDER_SITE_OTHER): Payer: Self-pay | Admitting: Physician Assistant

## 2018-09-09 ENCOUNTER — Ambulatory Visit (INDEPENDENT_AMBULATORY_CARE_PROVIDER_SITE_OTHER): Payer: Medicare Other | Admitting: Physician Assistant

## 2018-09-09 VITALS — Ht 70.5 in | Wt 272.0 lb

## 2018-09-09 DIAGNOSIS — S83282A Other tear of lateral meniscus, current injury, left knee, initial encounter: Secondary | ICD-10-CM

## 2018-09-09 DIAGNOSIS — M2342 Loose body in knee, left knee: Secondary | ICD-10-CM | POA: Diagnosis not present

## 2018-09-09 NOTE — Progress Notes (Signed)
HPI: Eric Jones returns today to go over the MRI left knee.  He continues to have periodic giving way of his left knee.  He was actually seen in the ER yesterday due to fall.  States he was getting in bed and turning to his left knee gave way and he went down to the ground.  He states that now he is having mid back pain.  Currently not having as much left knee pain.  3 views of his thoracic spine were performed yesterday in the ER showed no acute fracture no change in overall alignment.  Some degenerative changes were noted. MRI dated 09/05/2018 left knee is reviewed with the patient actual images are reviewed with the patient also knee model was used for visualization purposes.  This shows advanced lateral compartmental osteoarthritis degenerative maceration of the anterior horn and tearing throughout the body of the lateral meniscus.  A 1 cm loose body seen in the lateral aspect of the patellofemoral compartment.  Mild medial compartmental arthritis.  Patellofemoral compartment otherwise well-maintained.  Impression: Left knee lateral meniscal tear and loose body lateral to the patellofemoral compartment  Plan: Due to pain patient having recurrent effusion having mechanical symptoms of the knee and the findings on MRI recommend knee arthroscopy with partial lateral meniscectomy and possible removal loose body.  I discussed with him at length that this will not address the arthritis he has in the lateral compartment of the knee may benefit from supplemental injection postoperatively.  He did like to proceed with a left knee arthroscopy with partial medial meniscectomy and possible loose body removal.  Risks including but not limited to DVT/PE, infection worsening pain, nerve or vessel injury all reviewed with patient.  See him back 1 week postop.

## 2018-09-12 ENCOUNTER — Other Ambulatory Visit: Payer: Self-pay

## 2018-09-12 ENCOUNTER — Emergency Department (HOSPITAL_COMMUNITY)
Admission: EM | Admit: 2018-09-12 | Discharge: 2018-09-13 | Disposition: A | Discharge status: Home / Self Care | Payer: Medicare Other | Attending: Emergency Medicine | Admitting: Emergency Medicine

## 2018-09-12 ENCOUNTER — Encounter (HOSPITAL_COMMUNITY): Payer: Self-pay | Admitting: Oncology

## 2018-09-12 ENCOUNTER — Emergency Department (HOSPITAL_COMMUNITY): Payer: Medicare Other

## 2018-09-12 DIAGNOSIS — Y929 Unspecified place or not applicable: Secondary | ICD-10-CM | POA: Insufficient documentation

## 2018-09-12 DIAGNOSIS — E119 Type 2 diabetes mellitus without complications: Secondary | ICD-10-CM | POA: Diagnosis not present

## 2018-09-12 DIAGNOSIS — S2220XA Unspecified fracture of sternum, initial encounter for closed fracture: Secondary | ICD-10-CM | POA: Insufficient documentation

## 2018-09-12 DIAGNOSIS — Y999 Unspecified external cause status: Secondary | ICD-10-CM | POA: Diagnosis not present

## 2018-09-12 DIAGNOSIS — Y9389 Activity, other specified: Secondary | ICD-10-CM | POA: Diagnosis not present

## 2018-09-12 DIAGNOSIS — Z7984 Long term (current) use of oral hypoglycemic drugs: Secondary | ICD-10-CM | POA: Insufficient documentation

## 2018-09-12 DIAGNOSIS — I10 Essential (primary) hypertension: Secondary | ICD-10-CM | POA: Insufficient documentation

## 2018-09-12 MED ORDER — OXYCODONE-ACETAMINOPHEN 5-325 MG PO TABS
2.0000 | ORAL_TABLET | Freq: Once | ORAL | Status: AC
  Administered 2018-09-12: 2 via ORAL
  Filled 2018-09-12: qty 2

## 2018-09-12 MED ORDER — OXYCODONE-ACETAMINOPHEN 5-325 MG PO TABS: 2.0000 | ORAL_TABLET | ORAL | 0 refills | Status: AC | PRN

## 2018-09-12 MED ORDER — DIAZEPAM 5 MG PO TABS
5.0000 mg | ORAL_TABLET | Freq: Once | ORAL | Status: AC
  Administered 2018-09-12: 5 mg via ORAL
  Filled 2018-09-12: qty 1

## 2018-09-12 MED ORDER — MORPHINE SULFATE (PF) 4 MG/ML IV SOLN
4.0000 mg | Freq: Once | INTRAVENOUS | Status: AC
  Administered 2018-09-12: 4 mg via INTRAVENOUS
  Filled 2018-09-12: qty 1

## 2018-09-12 MED ORDER — DIAZEPAM 2 MG PO TABS: 2.0000 mg | ORAL_TABLET | Freq: Four times a day (QID) | ORAL | 0 refills | Status: AC | PRN
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
## 2018-09-12 NOTE — ED Notes (Signed)
Sharp chest paion after coughing  Pain mmed given will go to xray shortly
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
## 2018-09-16 ENCOUNTER — Telehealth (INDEPENDENT_AMBULATORY_CARE_PROVIDER_SITE_OTHER): Payer: Self-pay | Admitting: Orthopaedic Surgery
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
## 2018-09-16 NOTE — Telephone Encounter (Signed)
FYI

## 2018-09-24 ENCOUNTER — Encounter: Payer: Self-pay | Admitting: Primary Care

## 2018-09-24 DIAGNOSIS — M23252 Derangement of posterior horn of lateral meniscus due to old tear or injury, left knee: Secondary | ICD-10-CM

## 2018-10-01 ENCOUNTER — Inpatient Hospital Stay (INDEPENDENT_AMBULATORY_CARE_PROVIDER_SITE_OTHER): Payer: Medicare Other | Admitting: Physician Assistant

## 2018-10-06 ENCOUNTER — Encounter (INDEPENDENT_AMBULATORY_CARE_PROVIDER_SITE_OTHER): Payer: Self-pay | Admitting: Orthopaedic Surgery

## 2018-10-06 ENCOUNTER — Telehealth (INDEPENDENT_AMBULATORY_CARE_PROVIDER_SITE_OTHER): Payer: Self-pay

## 2018-10-06 ENCOUNTER — Ambulatory Visit (INDEPENDENT_AMBULATORY_CARE_PROVIDER_SITE_OTHER): Payer: Medicare Other | Admitting: Orthopaedic Surgery

## 2018-10-06 DIAGNOSIS — Z9889 Other specified postprocedural states: Secondary | ICD-10-CM | POA: Insufficient documentation

## 2018-10-06 NOTE — Progress Notes (Signed)
The patient is here 2 weeks after a left knee arthroscopy.  He is a significantly obese 77 year old diabetic and we did find areas of almost full-thickness cartilage loss in the medial femoral condyle of his knee.  There is some cartilage remaining side says more of a grade 3 but there were some areas of grade IV chondromalacia.  He also had a large medial meniscal tear.  He says he is doing well overall and has just some soreness in his knee.  I was able to show him quad training exercises to try for his knee.  I stressed the importance of weight loss and good diabetic control.  We did remove the sutures from his knee.  He does have just a mild effusion.  He has varus malalignment as well.  He is a perfect candidate for trying hyaluronic acid for his left knee.  I had a long and thorough discussion about this for him and gave him a handout as well.  He is very interested in this.  We will see him back in 3 weeks to hopefully place a Monovisc in his left knee.  All question concerns were answered and addressed.

## 2018-10-06 NOTE — Telephone Encounter (Signed)
Left knee monovisc  

## 2018-10-08 NOTE — Telephone Encounter (Signed)
Noted  

## 2018-10-09 ENCOUNTER — Ambulatory Visit: Payer: Medicare Other | Admitting: Podiatry

## 2018-10-09 ENCOUNTER — Telehealth (INDEPENDENT_AMBULATORY_CARE_PROVIDER_SITE_OTHER): Payer: Self-pay

## 2018-10-09 NOTE — Telephone Encounter (Signed)
Submitted VOB for SynviscOne, left knee. 

## 2018-10-15 ENCOUNTER — Telehealth (INDEPENDENT_AMBULATORY_CARE_PROVIDER_SITE_OTHER): Payer: Self-pay

## 2018-10-15 NOTE — Telephone Encounter (Signed)
Patient is approved for SynviscOne, left knee. [place] Covered at 80%, patient will be responsible for 20% OOP. No Co-pay No PA required  Appt. 10/27/2018 with Dr. Ninfa Linden

## 2018-10-27 ENCOUNTER — Ambulatory Visit (INDEPENDENT_AMBULATORY_CARE_PROVIDER_SITE_OTHER): Payer: Medicare Other | Admitting: Orthopaedic Surgery

## 2018-10-28 ENCOUNTER — Ambulatory Visit (INDEPENDENT_AMBULATORY_CARE_PROVIDER_SITE_OTHER): Payer: Medicare Other | Admitting: Orthopaedic Surgery

## 2018-10-28 ENCOUNTER — Encounter (INDEPENDENT_AMBULATORY_CARE_PROVIDER_SITE_OTHER): Payer: Self-pay | Admitting: Orthopaedic Surgery

## 2018-10-28 DIAGNOSIS — M1712 Unilateral primary osteoarthritis, left knee: Secondary | ICD-10-CM | POA: Diagnosis not present

## 2018-10-28 MED ORDER — HYLAN G-F 20 48 MG/6ML IX SOSY
48.0000 mg | PREFILLED_SYRINGE | INTRA_ARTICULAR | Status: AC | PRN
  Administered 2018-10-28: 48 mg via INTRA_ARTICULAR

## 2018-10-28 MED ORDER — LIDOCAINE HCL 1 % IJ SOLN
0.5000 mL | INTRAMUSCULAR | Status: AC | PRN
  Administered 2018-10-28: .5 mL

## 2018-10-28 NOTE — Progress Notes (Signed)
   Procedure Note  Patient: Eric Jones             Date of Birth: 11-13-1941           MRN: [id number]             Visit Date: 10/28/2018 HPI: Eric Jones comes in today for his left knee Synvisc 1 injection.  States he has some pain in the knee but overall is doing well.  Said no new injury to the knee.  He has known osteoarthritis left knee.  He is now 5 weeks status post left knee arthroscopy.  Physical exam: Left knee no abnormal warmth erythema positive effusion.  He has good range of motion of the knee without significant pain.  Procedures: Visit Diagnoses: Unilateral primary osteoarthritis, left knee  Large Joint Inj on 10/28/2018 9:28 AM Indications: pain Details: 22 G 1.5 in needle, anterolateral approach  Arthrogram: No  Medications: 0.5 mL lidocaine 1 %; 48 mg Hylan 48 MG/6ML Aspirate: 45 mL blood-tinged and yellow Outcome: tolerated well, no immediate complications Procedure, treatment alternatives, risks and benefits explained, specific risks discussed. Consent was given by the patient. Immediately prior to procedure a time out was called to verify the correct patient, procedure, equipment, support staff and site/side marked as required. Patient was prepped and draped in the usual sterile fashion.    Plan: I have him follow-up on an as-needed basis.  He understands he can have cortisone injections in the knee no more often than every 3 months and Synvisc injections no more often than every 6 months.  Questions are encouraged and answered.

## 2018-11-04 ENCOUNTER — Ambulatory Visit (INDEPENDENT_AMBULATORY_CARE_PROVIDER_SITE_OTHER): Payer: Medicare Other | Admitting: Orthopaedic Surgery

## 2018-11-06 ENCOUNTER — Ambulatory Visit (INDEPENDENT_AMBULATORY_CARE_PROVIDER_SITE_OTHER): Payer: Medicare Other | Admitting: Podiatry

## 2018-11-06 ENCOUNTER — Encounter: Payer: Self-pay | Admitting: Podiatry

## 2018-11-06 DIAGNOSIS — E1140 Type 2 diabetes mellitus with diabetic neuropathy, unspecified: Secondary | ICD-10-CM | POA: Diagnosis not present

## 2018-11-06 DIAGNOSIS — B351 Tinea unguium: Secondary | ICD-10-CM

## 2018-11-06 DIAGNOSIS — M79675 Pain in left toe(s): Secondary | ICD-10-CM | POA: Diagnosis not present

## 2018-11-06 DIAGNOSIS — M79674 Pain in right toe(s): Secondary | ICD-10-CM

## 2018-11-06 NOTE — Progress Notes (Signed)
Complaint:  Visit Type: Patient returns to my office for continued preventative foot care services. Complaint: Patient states" my nails have grown long and thick and become painful to walk and wear shoes" Patient has been diagnosed with DM with no foot complications. The patient presents for preventative foot care services. No changes to ROS  Podiatric Exam: Vascular: dorsalis pedis and posterior tibial pulses are palpable bilateral. Capillary return is immediate. Temperature gradient is WNL. Skin turgor WNL  Sensorium: Normal Semmes Weinstein monofilament test. Normal tactile sensation bilaterally. Nail Exam: Pt has thick disfigured discolored nails with subungual debris noted bilateral entire nail hallux through fifth toenails Ulcer Exam: There is no evidence of ulcer or pre-ulcerative changes or infection. Orthopedic Exam: Muscle tone and strength are WNL. No limitations in general ROM. No crepitus or effusions noted. Foot type and digits show no abnormalities. Bony prominences are unremarkable. Skin: No Porokeratosis. No infection or ulcers  Diagnosis:  Onychomycosis, , Pain in right toe, pain in left toes  Treatment & Plan Procedures and Treatment: Consent by patient was obtained for treatment procedures.   Debridement of mycotic and hypertrophic toenails, 1 through 5 bilateral and clearing of subungual debris. No ulceration, no infection noted.  Return Visit-Office Procedure: Patient instructed to return to the office for a follow up visit 3 months for continued evaluation and treatment.    Juni Glaab DPM 

## 2018-12-28 ENCOUNTER — Encounter (INDEPENDENT_AMBULATORY_CARE_PROVIDER_SITE_OTHER): Payer: Self-pay | Admitting: Orthopaedic Surgery

## 2018-12-28 ENCOUNTER — Ambulatory Visit (INDEPENDENT_AMBULATORY_CARE_PROVIDER_SITE_OTHER): Payer: Medicare Other | Admitting: Orthopaedic Surgery

## 2018-12-28 DIAGNOSIS — G8929 Other chronic pain: Secondary | ICD-10-CM

## 2018-12-28 DIAGNOSIS — M25561 Pain in right knee: Secondary | ICD-10-CM

## 2018-12-28 DIAGNOSIS — M25562 Pain in left knee: Secondary | ICD-10-CM | POA: Diagnosis not present

## 2018-12-28 MED ORDER — LIDOCAINE HCL 1 % IJ SOLN
3.0000 mL | INTRAMUSCULAR | Status: AC | PRN
  Administered 2018-12-28: 3 mL

## 2018-12-28 MED ORDER — METHYLPREDNISOLONE ACETATE 40 MG/ML IJ SUSP
40.0000 mg | INTRAMUSCULAR | Status: AC | PRN
  Administered 2018-12-28: 40 mg via INTRA_ARTICULAR

## 2018-12-28 NOTE — Progress Notes (Signed)
Office Visit Note   Patient: Eric Jones           Date of Birth: Oct 13, 1941           MRN: [id number] Visit Date: 12/28/2018              Requested by: Shirline Frees, MD [address], [place] [id number] PCP: Shirline Frees, MD   Assessment & Plan: Visit Diagnoses:  1. Chronic pain of left knee   2. Chronic pain of right knee     Plan: I did counsel him about the effects of steroids can have on his blood glucose and the risk of injections.  He did wish to have these today.  He tolerated them well.  He knows to wait at least 4 months before repeat knee injections.  We did talk about the possibility of knee replacement in the future as well.  Follow-Up Instructions: Return if symptoms worsen or fail to improve.   Orders:  Orders Placed This Encounter  Procedures  . Large Joint Inj  . Large Joint Inj   No orders of the defined types were placed in this encounter.     Procedures: Large Joint Inj: R knee on 12/28/2018 8:29 AM Indications: diagnostic evaluation and pain Details: 22 G 1.5 in needle, superolateral approach  Arthrogram: No  Medications: 3 mL lidocaine 1 %; 40 mg methylPREDNISolone acetate 40 MG/ML Outcome: tolerated well, no immediate complications Procedure, treatment alternatives, risks and benefits explained, specific risks discussed. Consent was given by the patient. Immediately prior to procedure a time out was called to verify the correct patient, procedure, equipment, support staff and site/side marked as required. Patient was prepped and draped in the usual sterile fashion.   Large Joint Inj: L knee on 12/28/2018 8:29 AM Indications: diagnostic evaluation and pain Details: 22 G 1.5 in needle, superolateral approach  Arthrogram: No  Medications: 3 mL lidocaine 1 %; 40 mg methylPREDNISolone acetate 40 MG/ML Outcome: tolerated well, no immediate complications Procedure, treatment alternatives, risks and benefits explained,  specific risks discussed. Consent was given by the patient. Immediately prior to procedure a time out was called to verify the correct patient, procedure, equipment, support staff and site/side marked as required. Patient was prepped and draped in the usual sterile fashion.       Clinical Data: No additional findings.   Subjective: Chief Complaint  Patient presents with  . Left Knee - Follow-up  . Right Knee - Follow-up  The patient is well-known to me.  He has known osteoarthritis and degenerative joint  disease of both his knees with bilateral leg pain.  The left is worse than right.  He is even had a left knee arthroscopy.  He has had a history of a left knee steroid injection and a hyaluronic acid injection.  These have helped temporize his symptoms.  He would like to have steroids in both his knees today.  He does report good blood glucose control being a diabetic.  However he says his last hemoglobin A1c was just over 8.  He knows it needs to get lower.  He said no other acute changes in medical status.  HPI  Review of Systems He currently denies any headache, chest pain, short of breath, fever, chills, nausea, vomiting  Objective: Vital Signs: There were no vitals taken for this visit.  Physical Exam He is alert and orient x3 and in no acute distress Ortho Exam Examination of both knees shows slight  varus malalignment.  Both knees have global tenderness.  Both knees have slight effusion with full range of motion.  Both knees are ligamentously stable. Specialty Comments:  No specialty comments available.  Imaging: No results found.   PMFS History: Patient Active Problem List   Diagnosis Date Noted  . Status post arthroscopy of left knee 10/06/2018   Past Medical History:  Diagnosis Date  . Cancer ([place])   . Diabetes mellitus without complication ([employer])   . Hypertension     History reviewed. No pertinent family history.  Past Surgical History:  Procedure Laterality  Date  . EYE SURGERY     Social History   Occupational History  . Not on file  Tobacco Use  . Smoking status: Never Smoker  . Smokeless tobacco: Never Used  Substance and Sexual Activity  . Alcohol use: No  . Drug use: No  . Sexual activity: Yes

## 2019-02-05 ENCOUNTER — Ambulatory Visit (INDEPENDENT_AMBULATORY_CARE_PROVIDER_SITE_OTHER): Payer: Medicare Other | Admitting: Podiatry

## 2019-02-05 ENCOUNTER — Encounter: Payer: Self-pay | Admitting: Podiatry

## 2019-02-05 DIAGNOSIS — M79674 Pain in right toe(s): Secondary | ICD-10-CM

## 2019-02-05 DIAGNOSIS — E1140 Type 2 diabetes mellitus with diabetic neuropathy, unspecified: Secondary | ICD-10-CM | POA: Diagnosis not present

## 2019-02-05 DIAGNOSIS — B351 Tinea unguium: Secondary | ICD-10-CM

## 2019-02-05 DIAGNOSIS — M79675 Pain in left toe(s): Secondary | ICD-10-CM | POA: Diagnosis not present

## 2019-02-05 NOTE — Progress Notes (Signed)
Complaint:  Visit Type: Patient returns to my office for continued preventative foot care services. Complaint: Patient states" my nails have grown long and thick and become painful to walk and wear shoes" Patient has been diagnosed with DM with no foot complications. The patient presents for preventative foot care services. No changes to ROS  Podiatric Exam: Vascular: dorsalis pedis and posterior tibial pulses are palpable bilateral. Capillary return is immediate. Temperature gradient is WNL. Skin turgor WNL  Sensorium: Normal Semmes Weinstein monofilament test. Normal tactile sensation bilaterally. Nail Exam: Pt has thick disfigured discolored nails with subungual debris noted bilateral entire nail hallux through fifth toenails Ulcer Exam: There is no evidence of ulcer or pre-ulcerative changes or infection. Orthopedic Exam: Muscle tone and strength are WNL. No limitations in general ROM. No crepitus or effusions noted. Foot type and digits show no abnormalities. Bony prominences are unremarkable. Skin: No Porokeratosis. No infection or ulcers  Diagnosis:  Onychomycosis, , Pain in right toe, pain in left toes  Treatment & Plan Procedures and Treatment: Consent by patient was obtained for treatment procedures.   Debridement of mycotic and hypertrophic toenails, 1 through 5 bilateral and clearing of subungual debris. No ulceration, no infection noted.  Return Visit-Office Procedure: Patient instructed to return to the office for a follow up visit 3 months for continued evaluation and treatment.    Amir Fick DPM 

## 2019-02-23 IMAGING — MR MR KNEE*L* W/O CM
7 series · 38 of 40 positions shown · non-contrast
Comparison: Plain films left knee 09/23/2014.

CLINICAL DATA: Diffuse left knee pain for 6 months. No known
injury.

EXAM:
MRI OF THE LEFT KNEE WITHOUT CONTRAST
TECHNIQUE: Multiplanar, multisequence MR imaging of the knee was performed. No
intravenous contrast was administered.

[Series 6: T2 fat-sat · axial · left · 4.0mm · 0.50mm/px · z∈[-75,+78]mm · 6 of 36 slices shown (1 of 3)]
[im 1/36]
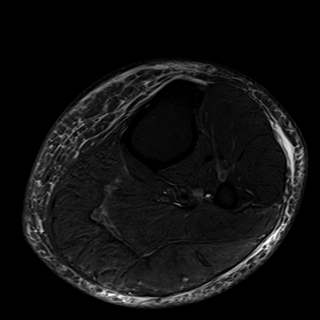
[im 8/36]
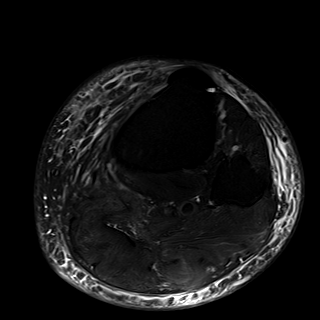
[im 15/36]
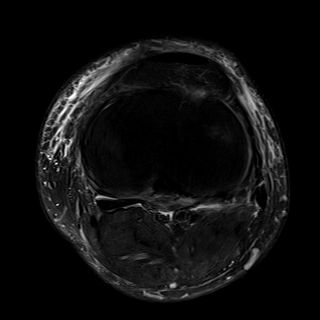
[im 22/36]
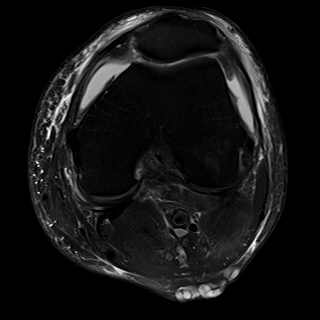
[im 29/36]
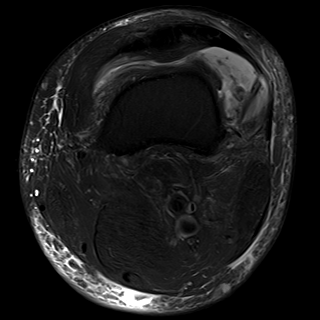
[im 36/36]
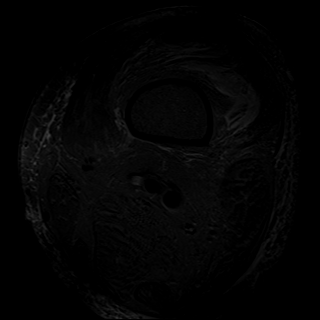

[Series 7: T2 fat-sat · coronal · left · 4.0mm · 0.39mm/px · 5 of 30 slices shown (2 of 3)]
[im 1/30]
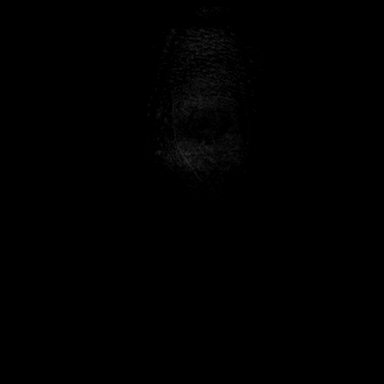
[im 8/30]
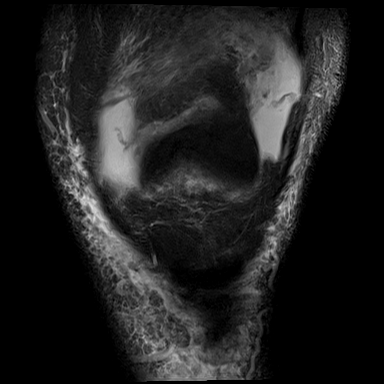
[im 15/30]
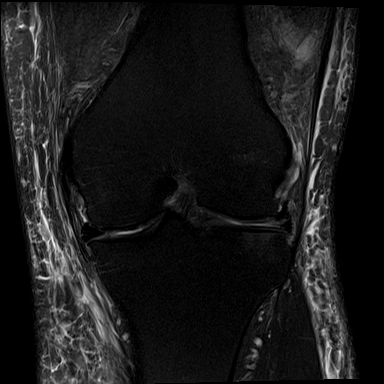
[im 22/30]
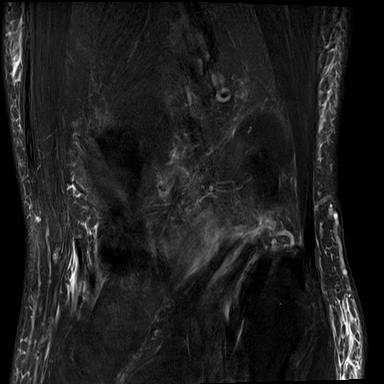
[im 30/30]
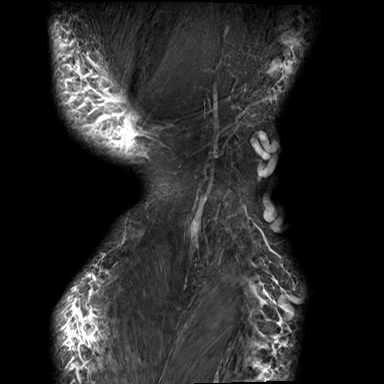

[Series 8: T1 · coronal · left · 4.0mm · 0.39mm/px · 4 of 30 slices shown]
[im 1/30]
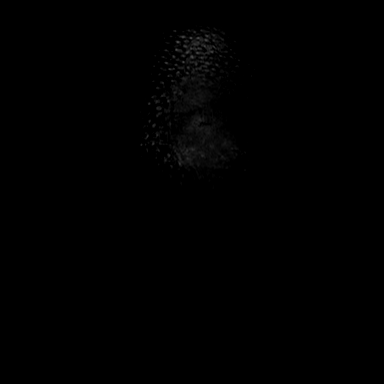
[im 6/30]
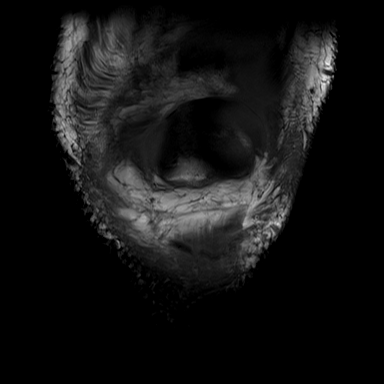
[im 12/30]
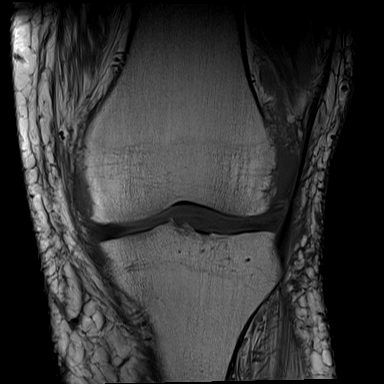
[im 18/30]
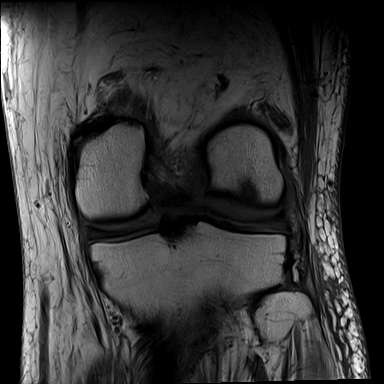

[Series 9: PD fat-sat · coronal · left · 3.0mm · 0.47mm/px · 7 of 36 slices shown (1 of 2)]
[im 1/36]
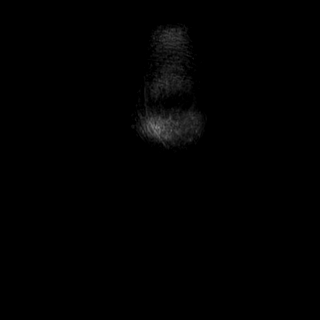
[im 6/36]
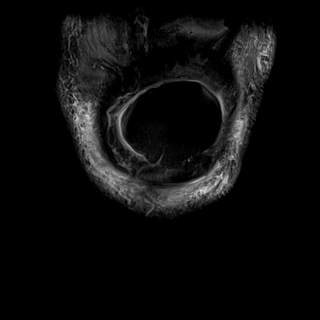
[im 12/36]
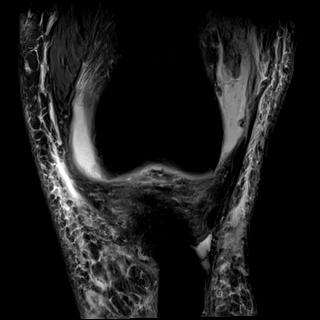
[im 18/36]
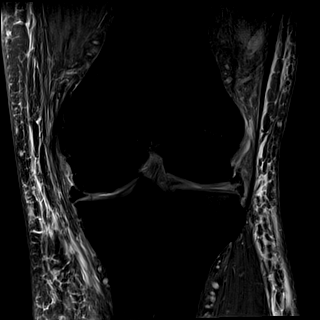
[im 24/36]
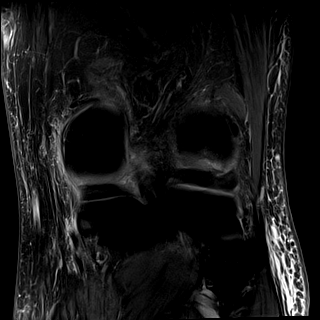
[im 30/36]
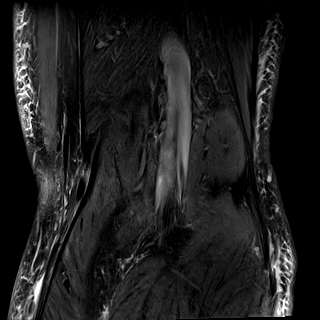
[im 36/36]
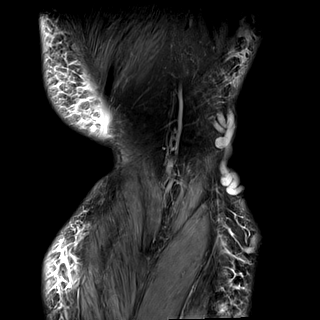

[Series 10: PD fat-sat · sagittal · left · 3.0mm · 0.42mm/px · 6 of 32 slices shown (2 of 2)]
[im 1/32]
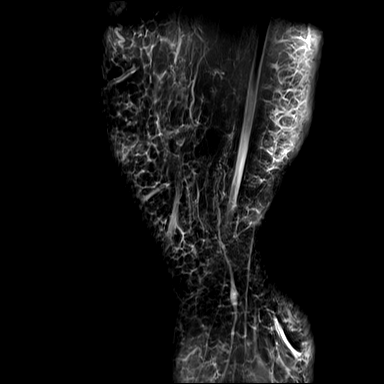
[im 7/32]
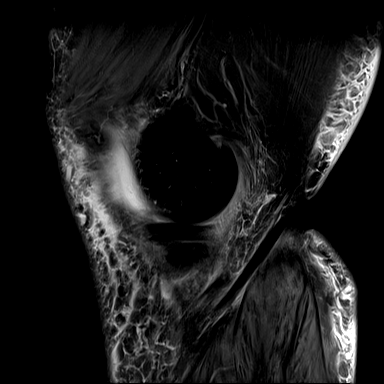
[im 13/32]
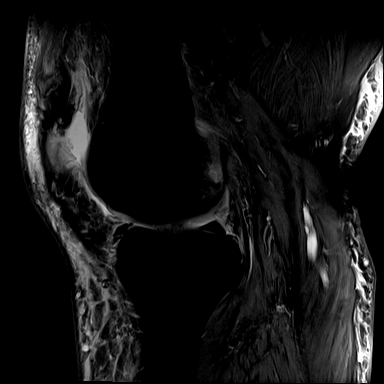
[im 19/32]
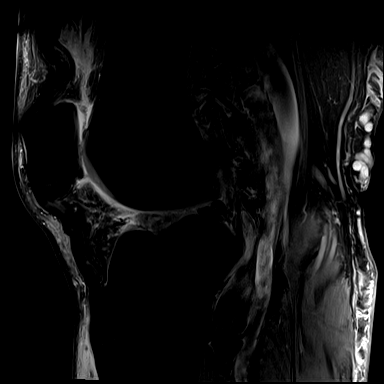
[im 25/32]
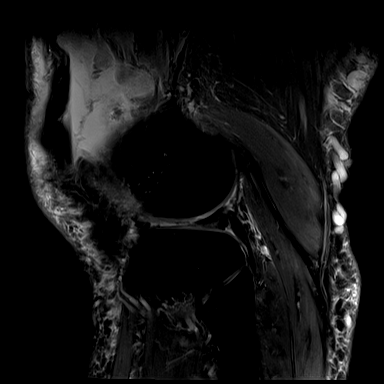
[im 32/32]
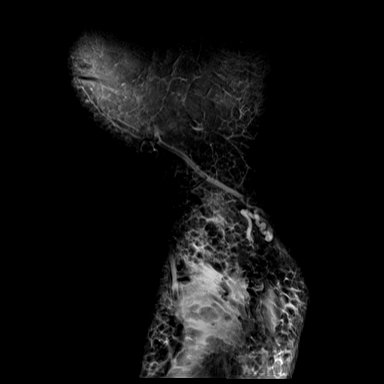

[Series 11: T2 fat-sat · sagittal · left · 3.0mm · 0.42mm/px · 6 of 32 slices shown (3 of 3)]
[im 1/32]
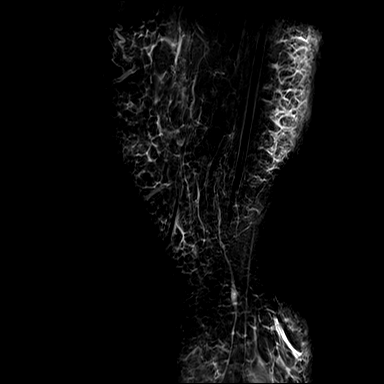
[im 7/32]
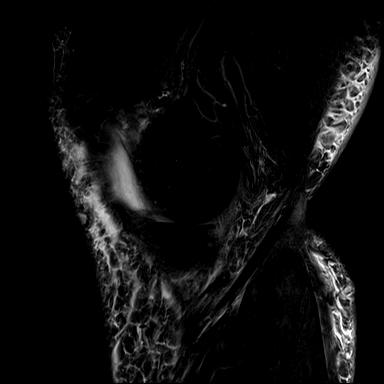
[im 13/32]
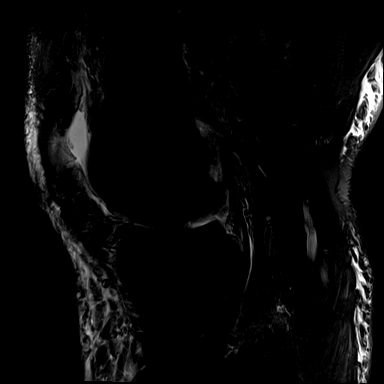
[im 19/32]
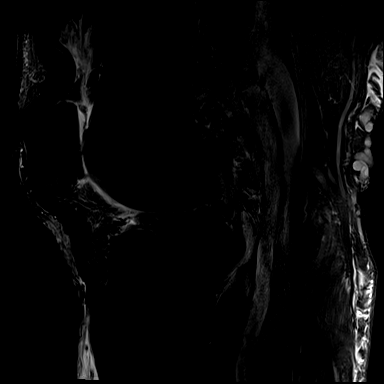
[im 25/32]
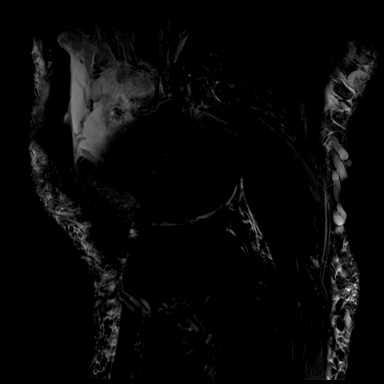
[im 32/32]
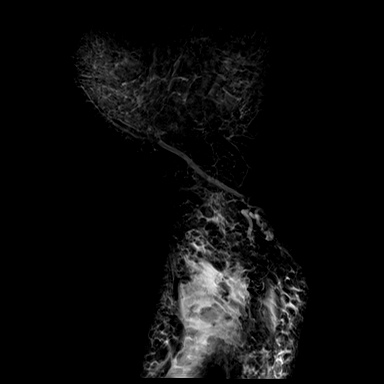

[Series 12: PD · coronal · left · 1.5mm · 0.44mm/px · 4 of 21 slices shown]
[im 1/21]
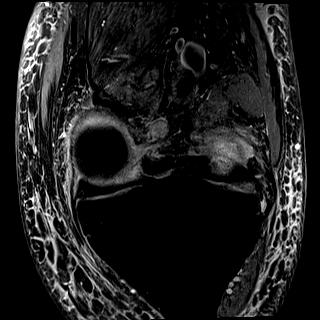
[im 7/21]
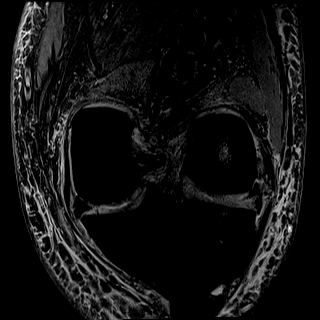
[im 14/21]
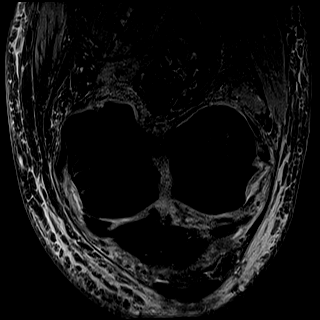
[im 21/21]
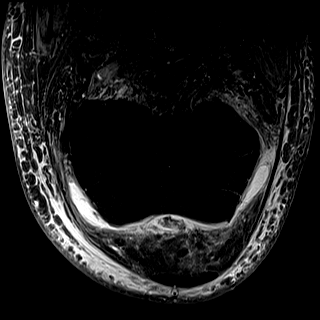

[38 of 40 positions shown; findings below may reference images not displayed]

FINDINGS: MENISCI

Medial meniscus:  Intact.

Lateral meniscus: The anterior horn is not visualized consistent
with degenerative maceration. There is a large horizontal tear
reaching the meniscal undersurface throughout the body. Radial
tearing is seen along the free edge of the anterior body.

LIGAMENTS

Cruciates:  Intact.

Collaterals:  Intact.

CARTILAGE

Patellofemoral:  Preserved.

Medial:  Mildly degenerated.

Lateral: Markedly degenerated. A full-thickness defect along the
posterior weight-bearing lateral femoral condyle measures 1.3 cm AP
by 1.3 cm transverse.

Joint: Small to moderate joint effusion is present. Loose body
lateral aspect of the patellofemoral compartment measures 1 cm in
diameter.

Popliteal Fossa:  No Baker's cyst.

Extensor Mechanism:  Intact.

Bones: Subchondral edema is seen in the posterior weight-bearing
femoral condyle subjacent to the above described cartilage defect.
Lateral osteophytes are identified.

Other: All imaged musculature about the knee demonstrates some fatty
replacement.
IMPRESSION: Advanced lateral compartment osteoarthritis with associated
degenerative maceration of the anterior horn and tearing throughout
the body of the lateral meniscus. 1 cm loose body in the lateral
aspect of the patellofemoral compartment noted. Mild medial
compartment osteoarthritis is also seen.

Fatty replacement of all musculature about the knee is likely
related to disuse.
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
## 2019-04-23 ENCOUNTER — Ambulatory Visit: Payer: Medicare Other | Admitting: Podiatry

## 2019-04-23 ENCOUNTER — Other Ambulatory Visit: Payer: Self-pay

## 2019-04-23 ENCOUNTER — Encounter: Payer: Self-pay | Admitting: Podiatry

## 2019-04-23 VITALS — Temp 97.7°F

## 2019-04-23 DIAGNOSIS — M79675 Pain in left toe(s): Secondary | ICD-10-CM

## 2019-04-23 DIAGNOSIS — E1140 Type 2 diabetes mellitus with diabetic neuropathy, unspecified: Secondary | ICD-10-CM | POA: Diagnosis not present

## 2019-04-23 DIAGNOSIS — M79674 Pain in right toe(s): Secondary | ICD-10-CM | POA: Diagnosis not present

## 2019-04-23 DIAGNOSIS — B351 Tinea unguium: Secondary | ICD-10-CM

## 2019-04-23 NOTE — Progress Notes (Signed)
Complaint:  Visit Type: Patient returns to my office for continued preventative foot care services. Complaint: Patient states" my nails have grown long and thick and become painful to walk and wear shoes" Patient has been diagnosed with DM with no foot complications. The patient presents for preventative foot care services. No changes to ROS  Podiatric Exam: Vascular: dorsalis pedis and posterior tibial pulses are palpable bilateral. Capillary return is immediate. Temperature gradient is WNL. Skin turgor WNL  Sensorium: Normal Semmes Weinstein monofilament test. Normal tactile sensation bilaterally. Nail Exam: Pt has thick disfigured discolored nails with subungual debris noted bilateral entire nail hallux through fifth toenails Ulcer Exam: There is no evidence of ulcer or pre-ulcerative changes or infection. Orthopedic Exam: Muscle tone and strength are WNL. No limitations in general ROM. No crepitus or effusions noted. Foot type and digits show no abnormalities. Bony prominences are unremarkable. Skin: No Porokeratosis. No infection or ulcers  Diagnosis:  Onychomycosis, , Pain in right toe, pain in left toes  Treatment & Plan Procedures and Treatment: Consent by patient was obtained for treatment procedures.   Debridement of mycotic and hypertrophic toenails, 1 through 5 bilateral and clearing of subungual debris. No ulceration, no infection noted.  Return Visit-Office Procedure: Patient instructed to return to the office for a follow up visit 3 months for continued evaluation and treatment.    Jessup Ogas DPM 

## 2019-07-02 ENCOUNTER — Other Ambulatory Visit: Payer: Self-pay

## 2019-07-02 ENCOUNTER — Ambulatory Visit (INDEPENDENT_AMBULATORY_CARE_PROVIDER_SITE_OTHER): Payer: Medicare Other | Admitting: Podiatry

## 2019-07-02 ENCOUNTER — Encounter: Payer: Self-pay | Admitting: Podiatry

## 2019-07-02 VITALS — Temp 97.5°F

## 2019-07-02 DIAGNOSIS — B351 Tinea unguium: Secondary | ICD-10-CM

## 2019-07-02 DIAGNOSIS — M79675 Pain in left toe(s): Secondary | ICD-10-CM

## 2019-07-02 DIAGNOSIS — E1140 Type 2 diabetes mellitus with diabetic neuropathy, unspecified: Secondary | ICD-10-CM | POA: Diagnosis not present

## 2019-07-02 DIAGNOSIS — M79674 Pain in right toe(s): Secondary | ICD-10-CM

## 2019-07-02 NOTE — Progress Notes (Signed)
Complaint:  Visit Type: Patient returns to my office for continued preventative foot care services. Complaint: Patient states" my nails have grown long and thick and become painful to walk and wear shoes" Patient has been diagnosed with DM with no foot complications. The patient presents for preventative foot care services. No changes to ROS  Podiatric Exam: Vascular: dorsalis pedis and posterior tibial pulses are palpable bilateral. Capillary return is immediate. Temperature gradient is WNL. Skin turgor WNL  Sensorium: Normal Semmes Weinstein monofilament test. Normal tactile sensation bilaterally. Nail Exam: Pt has thick disfigured discolored nails with subungual debris noted bilateral entire nail hallux through fifth toenails Ulcer Exam: There is no evidence of ulcer or pre-ulcerative changes or infection. Orthopedic Exam: Muscle tone and strength are WNL. No limitations in general ROM. No crepitus or effusions noted. Foot type and digits show no abnormalities. Bony prominences are unremarkable. Skin: No Porokeratosis. No infection or ulcers  Diagnosis:  Onychomycosis, , Pain in right toe, pain in left toes  Treatment & Plan Procedures and Treatment: Consent by patient was obtained for treatment procedures.   Debridement of mycotic and hypertrophic toenails, 1 through 5 bilateral and clearing of subungual debris. No ulceration, no infection noted.  Return Visit-Office Procedure: Patient instructed to return to the office for a follow up visit 3 months for continued evaluation and treatment.    Mckinleigh Schuchart DPM 

## 2019-09-17 ENCOUNTER — Encounter: Payer: Self-pay | Admitting: Podiatry

## 2019-09-17 ENCOUNTER — Other Ambulatory Visit: Payer: Self-pay

## 2019-09-17 ENCOUNTER — Ambulatory Visit: Payer: Medicare Other | Admitting: Podiatry

## 2019-09-17 DIAGNOSIS — M79675 Pain in left toe(s): Secondary | ICD-10-CM | POA: Diagnosis not present

## 2019-09-17 DIAGNOSIS — E1140 Type 2 diabetes mellitus with diabetic neuropathy, unspecified: Secondary | ICD-10-CM

## 2019-09-17 DIAGNOSIS — B351 Tinea unguium: Secondary | ICD-10-CM

## 2019-09-17 DIAGNOSIS — L84 Corns and callosities: Secondary | ICD-10-CM | POA: Insufficient documentation

## 2019-09-17 DIAGNOSIS — M79674 Pain in right toe(s): Secondary | ICD-10-CM | POA: Diagnosis not present

## 2019-09-17 NOTE — Progress Notes (Signed)
Complaint:  Visit Type: Patient returns to my office for continued preventative foot care services. Complaint: Patient states" my nails have grown long and thick and become painful to walk and wear shoes" Patient has been diagnosed with DM with no foot complications. The patient presents for preventative foot care services. No changes to ROS  Podiatric Exam: Vascular: dorsalis pedis and posterior tibial pulses are palpable bilateral. Capillary return is immediate. Temperature gradient is WNL. Skin turgor WNL  Sensorium: Normal Semmes Weinstein monofilament test. Normal tactile sensation bilaterally. Nail Exam: Pt has thick disfigured discolored nails with subungual debris noted bilateral entire nail hallux through fifth toenails Ulcer Exam: There is no evidence of ulcer or pre-ulcerative changes or infection. Orthopedic Exam: Muscle tone and strength are WNL. No limitations in general ROM. No crepitus or effusions noted. Contracted digits  B/L Bony prominences are unremarkable. Skin: No Porokeratosis. No infection or ulcers.  Clavi second toe right foot.  Diagnosis:  Onychomycosis, , Pain in right toe, pain in left toes  Treatment & Plan Procedures and Treatment: Consent by patient was obtained for treatment procedures.   Debridement of mycotic and hypertrophic toenails, 1 through 5 bilateral and clearing of subungual debris. No ulceration, no infection noted. Debride clavi. Return Visit-Office Procedure: Patient instructed to return to the office for a follow up visit 3 months for continued evaluation and treatment.    Gardiner Barefoot DPM

## 2019-12-21 ENCOUNTER — Other Ambulatory Visit: Payer: Self-pay

## 2019-12-21 ENCOUNTER — Ambulatory Visit (INDEPENDENT_AMBULATORY_CARE_PROVIDER_SITE_OTHER): Payer: Medicare Other | Admitting: Podiatry

## 2019-12-21 ENCOUNTER — Encounter: Payer: Self-pay | Admitting: Podiatry

## 2019-12-21 DIAGNOSIS — M79674 Pain in right toe(s): Secondary | ICD-10-CM | POA: Diagnosis not present

## 2019-12-21 DIAGNOSIS — E1140 Type 2 diabetes mellitus with diabetic neuropathy, unspecified: Secondary | ICD-10-CM

## 2019-12-21 DIAGNOSIS — B351 Tinea unguium: Secondary | ICD-10-CM

## 2019-12-21 DIAGNOSIS — M79675 Pain in left toe(s): Secondary | ICD-10-CM | POA: Diagnosis not present

## 2019-12-21 NOTE — Progress Notes (Signed)
Complaint:  Visit Type: Patient returns to my office for continued preventative foot care services. Complaint: Patient states" my nails have grown long and thick and become painful to walk and wear shoes" Patient has been diagnosed with DM with no foot complications. The patient presents for preventative foot care services. No changes to ROS  Podiatric Exam: Vascular: dorsalis pedis and posterior tibial pulses are palpable bilateral. Capillary return is immediate. Temperature gradient is WNL. Skin turgor WNL  Sensorium: Normal Semmes Weinstein monofilament test. Normal tactile sensation bilaterally. Nail Exam: Pt has thick disfigured discolored nails with subungual debris noted bilateral entire nail hallux through fifth toenails Ulcer Exam: There is no evidence of ulcer or pre-ulcerative changes or infection. Orthopedic Exam: Muscle tone and strength are WNL. No limitations in general ROM. No crepitus or effusions noted. Contracted digits  B/L Bony prominences are unremarkable. Skin: No Porokeratosis. No infection or ulcers.  Clavi second toe right foot.  Diagnosis:  Onychomycosis, , Pain in right toe, pain in left toes  Treatment & Plan Procedures and Treatment: Consent by patient was obtained for treatment procedures.   Debridement of mycotic and hypertrophic toenails, 1 through 5 bilateral and clearing of subungual debris. No ulceration, no infection noted. Debride clavi. Return Visit-Office Procedure: Patient instructed to return to the office for a follow up visit 3 months for continued evaluation and treatment.    Gardiner Barefoot DPM

## 2019-12-29 ENCOUNTER — Ambulatory Visit: Payer: Medicare Other

## 2020-01-07 ENCOUNTER — Ambulatory Visit: Payer: Medicare Other

## 2020-01-19 ENCOUNTER — Ambulatory Visit: Payer: Medicare Other

## 2020-03-22 ENCOUNTER — Other Ambulatory Visit: Payer: Self-pay

## 2020-03-22 ENCOUNTER — Encounter: Payer: Self-pay | Admitting: Podiatry

## 2020-03-22 ENCOUNTER — Ambulatory Visit (INDEPENDENT_AMBULATORY_CARE_PROVIDER_SITE_OTHER): Payer: Medicare Other | Admitting: Podiatry

## 2020-03-22 VITALS — Temp 97.3°F

## 2020-03-22 DIAGNOSIS — M79675 Pain in left toe(s): Secondary | ICD-10-CM | POA: Diagnosis not present

## 2020-03-22 DIAGNOSIS — M79674 Pain in right toe(s): Secondary | ICD-10-CM

## 2020-03-22 DIAGNOSIS — E1140 Type 2 diabetes mellitus with diabetic neuropathy, unspecified: Secondary | ICD-10-CM

## 2020-03-22 DIAGNOSIS — L84 Corns and callosities: Secondary | ICD-10-CM

## 2020-03-22 DIAGNOSIS — B351 Tinea unguium: Secondary | ICD-10-CM

## 2020-03-22 NOTE — Progress Notes (Signed)
This patient returns to my office for at risk foot care.  This patient requires this care by a professional since this patient will be at risk due to having diabetes.   This patient is unable to cut nails himself since the patient cannot reach his nails.These nails are painful walking and wearing shoes. Patient has painful corn second toe right.  This patient presents for at risk foot care today.  General Appearance  Alert, conversant and in no acute stress.  Vascular  Dorsalis pedis and posterior tibial  pulses are palpable  bilaterally.  Capillary return is within normal limits  bilaterally. Temperature is within normal limits  bilaterally.  Neurologic  Senn-Weinstein monofilament wire test within normal limits  bilaterally. Muscle power within normal limits bilaterally.  Nails Thick disfigured discolored nails with subungual debris  from hallux to fifth toes bilaterally. No evidence of bacterial infection or drainage bilaterally.  Orthopedic  No limitations of motion  feet .  No crepitus or effusions noted.  Rigid hammer toes 2-4  B/L.  Skin  normotropic skin with no porokeratosis noted bilaterally.  No signs of infections or ulcers noted.   Clavi second toe right foot.  Onychomycosis  Pain in right toes  Pain in left toes  Clavi second toe right.  Consent was obtained for treatment procedures.   Mechanical debridement of nails 1-5  bilaterally performed with a nail nipper.  Filed with dremel without incident. Debride clavi with # 15 blade.   Return office visit    3 months                  Told patient to return for periodic foot care and evaluation due to potential at risk complications.   Chandrea Zellman DPM  

## 2020-06-28 ENCOUNTER — Other Ambulatory Visit: Payer: Self-pay

## 2020-06-28 ENCOUNTER — Ambulatory Visit (INDEPENDENT_AMBULATORY_CARE_PROVIDER_SITE_OTHER): Payer: Medicare Other | Admitting: Podiatry

## 2020-06-28 ENCOUNTER — Encounter: Payer: Self-pay | Admitting: Podiatry

## 2020-06-28 DIAGNOSIS — M79674 Pain in right toe(s): Secondary | ICD-10-CM

## 2020-06-28 DIAGNOSIS — M79675 Pain in left toe(s): Secondary | ICD-10-CM

## 2020-06-28 DIAGNOSIS — B351 Tinea unguium: Secondary | ICD-10-CM

## 2020-06-28 DIAGNOSIS — L84 Corns and callosities: Secondary | ICD-10-CM

## 2020-06-28 DIAGNOSIS — E1140 Type 2 diabetes mellitus with diabetic neuropathy, unspecified: Secondary | ICD-10-CM

## 2020-06-28 NOTE — Progress Notes (Signed)
This patient returns to my office for at risk foot care.  This patient requires this care by a professional since this patient will be at risk due to having diabetes.   This patient is unable to cut nails himself since the patient cannot reach his nails.These nails are painful walking and wearing shoes. Patient has painful corn second toe right.  This patient presents for at risk foot care today.  General Appearance  Alert, conversant and in no acute stress.  Vascular  Dorsalis pedis and posterior tibial  pulses are palpable  bilaterally.  Capillary return is within normal limits  bilaterally. Temperature is within normal limits  bilaterally.  Neurologic  Senn-Weinstein monofilament wire test within normal limits  bilaterally. Muscle power within normal limits bilaterally.  Nails Thick disfigured discolored nails with subungual debris  from hallux to fifth toes bilaterally. No evidence of bacterial infection or drainage bilaterally.  Orthopedic  No limitations of motion  feet .  No crepitus or effusions noted.  Rigid hammer toes 2-4  B/L.  Skin  normotropic skin with no porokeratosis noted bilaterally.  No signs of infections or ulcers noted.   Clavi second toe right foot.  Onychomycosis  Pain in right toes  Pain in left toes  Clavi second toe right.  Consent was obtained for treatment procedures.   Mechanical debridement of nails 1-5  bilaterally performed with a nail nipper.  Filed with dremel without incident. Debride clavi with # 15 blade.   Return office visit    3 months                  Told patient to return for periodic foot care and evaluation due to potential at risk complications.   Gardiner Barefoot DPM

## 2020-09-29 ENCOUNTER — Other Ambulatory Visit: Payer: Self-pay

## 2020-09-29 ENCOUNTER — Encounter: Payer: Self-pay | Admitting: Podiatry

## 2020-09-29 ENCOUNTER — Ambulatory Visit (INDEPENDENT_AMBULATORY_CARE_PROVIDER_SITE_OTHER): Payer: Medicare Other | Admitting: Podiatry

## 2020-09-29 DIAGNOSIS — M79674 Pain in right toe(s): Secondary | ICD-10-CM | POA: Diagnosis not present

## 2020-09-29 DIAGNOSIS — E1140 Type 2 diabetes mellitus with diabetic neuropathy, unspecified: Secondary | ICD-10-CM

## 2020-09-29 DIAGNOSIS — L84 Corns and callosities: Secondary | ICD-10-CM | POA: Diagnosis not present

## 2020-09-29 DIAGNOSIS — M79675 Pain in left toe(s): Secondary | ICD-10-CM

## 2020-09-29 DIAGNOSIS — B351 Tinea unguium: Secondary | ICD-10-CM | POA: Diagnosis not present

## 2020-09-29 NOTE — Progress Notes (Signed)
This patient returns to my office for at risk foot care.  This patient requires this care by a professional since this patient will be at risk due to having diabetes.   This patient is unable to cut nails himself since the patient cannot reach his nails.These nails are painful walking and wearing shoes. Patient has painful corn second toe right.  This patient presents for at risk foot care today.  General Appearance  Alert, conversant and in no acute stress.  Vascular  Dorsalis pedis and posterior tibial  pulses are palpable  bilaterally.  Capillary return is within normal limits  bilaterally. Temperature is within normal limits  bilaterally.  Neurologic  Senn-Weinstein monofilament wire test within normal limits  bilaterally. Muscle power within normal limits bilaterally.  Nails Thick disfigured discolored nails with subungual debris  from hallux to fifth toes bilaterally. No evidence of bacterial infection or drainage bilaterally.  Orthopedic  No limitations of motion  feet .  No crepitus or effusions noted.  Rigid hammer toes 2-4  B/L.  Skin  normotropic skin with no porokeratosis noted bilaterally.  No signs of infections or ulcers noted.   Clavi second toe right foot asymptomatic  Onychomycosis  Pain in right toes  Pain in left toes    Consent was obtained for treatment procedures.   Mechanical debridement of nails 1-5  bilaterally performed with a nail nipper.  Filed with dremel without incident.    Return office visit    3 months                  Told patient to return for periodic foot care and evaluation due to potential at risk complications.   Gardiner Barefoot DPM

## 2021-01-05 ENCOUNTER — Encounter: Payer: Self-pay | Admitting: Podiatry

## 2021-01-05 ENCOUNTER — Ambulatory Visit (INDEPENDENT_AMBULATORY_CARE_PROVIDER_SITE_OTHER): Payer: Medicare Other | Admitting: Podiatry

## 2021-01-05 ENCOUNTER — Other Ambulatory Visit: Payer: Self-pay

## 2021-01-05 DIAGNOSIS — E1140 Type 2 diabetes mellitus with diabetic neuropathy, unspecified: Secondary | ICD-10-CM

## 2021-01-05 DIAGNOSIS — M79675 Pain in left toe(s): Secondary | ICD-10-CM

## 2021-01-05 DIAGNOSIS — M79674 Pain in right toe(s): Secondary | ICD-10-CM | POA: Diagnosis not present

## 2021-01-05 DIAGNOSIS — B351 Tinea unguium: Secondary | ICD-10-CM | POA: Diagnosis not present

## 2021-01-05 DIAGNOSIS — L84 Corns and callosities: Secondary | ICD-10-CM

## 2021-01-05 NOTE — Progress Notes (Signed)
This patient returns to my office for at risk foot care.  This patient requires this care by a professional since this patient will be at risk due to having diabetes.   This patient is unable to cut nails himself since the patient cannot reach his nails.These nails are painful walking and wearing shoes. Patient has painful corn second toe right.  This patient presents for at risk foot care today.  General Appearance  Alert, conversant and in no acute stress.  Vascular  Dorsalis pedis and posterior tibial  pulses are palpable  bilaterally.  Capillary return is within normal limits  bilaterally. Temperature is within normal limits  bilaterally.  Neurologic  Senn-Weinstein monofilament wire test within normal limits  bilaterally. Muscle power within normal limits bilaterally.  Nails Thick disfigured discolored nails with subungual debris  from hallux to fifth toes bilaterally. No evidence of bacterial infection or drainage bilaterally.  Orthopedic  No limitations of motion  feet .  No crepitus or effusions noted.  Rigid hammer toes 2-4  B/L.  Skin  normotropic skin with no porokeratosis noted bilaterally.  No signs of infections or ulcers noted.   Clavi second toe right foot asymptomatic  Onychomycosis  Pain in right toes  Pain in left toes    Consent was obtained for treatment procedures.   Mechanical debridement of nails 1-5  bilaterally performed with a nail nipper.  Filed with dremel without incident.    Return office visit    3 months                  Told patient to return for periodic foot care and evaluation due to potential at risk complications.   Tyshawn Keel DPM  

## 2021-02-21 DIAGNOSIS — N182 Chronic kidney disease, stage 2 (mild): Secondary | ICD-10-CM | POA: Diagnosis not present

## 2021-02-26 DIAGNOSIS — R809 Proteinuria, unspecified: Secondary | ICD-10-CM | POA: Diagnosis not present

## 2021-02-26 DIAGNOSIS — N182 Chronic kidney disease, stage 2 (mild): Secondary | ICD-10-CM | POA: Diagnosis not present

## 2021-02-26 DIAGNOSIS — R609 Edema, unspecified: Secondary | ICD-10-CM | POA: Diagnosis not present

## 2021-02-26 DIAGNOSIS — I129 Hypertensive chronic kidney disease with stage 1 through stage 4 chronic kidney disease, or unspecified chronic kidney disease: Secondary | ICD-10-CM | POA: Diagnosis not present

## 2021-02-26 DIAGNOSIS — E1122 Type 2 diabetes mellitus with diabetic chronic kidney disease: Secondary | ICD-10-CM | POA: Diagnosis not present

## 2021-03-21 DIAGNOSIS — N182 Chronic kidney disease, stage 2 (mild): Secondary | ICD-10-CM | POA: Diagnosis not present

## 2021-03-21 DIAGNOSIS — E1165 Type 2 diabetes mellitus with hyperglycemia: Secondary | ICD-10-CM | POA: Diagnosis not present

## 2021-03-21 DIAGNOSIS — E1121 Type 2 diabetes mellitus with diabetic nephropathy: Secondary | ICD-10-CM | POA: Diagnosis not present

## 2021-03-21 DIAGNOSIS — E7800 Pure hypercholesterolemia, unspecified: Secondary | ICD-10-CM | POA: Diagnosis not present

## 2021-03-21 DIAGNOSIS — Z0000 Encounter for general adult medical examination without abnormal findings: Secondary | ICD-10-CM | POA: Diagnosis not present

## 2021-03-21 DIAGNOSIS — I10 Essential (primary) hypertension: Secondary | ICD-10-CM | POA: Diagnosis not present

## 2021-04-06 ENCOUNTER — Ambulatory Visit: Payer: Medicare Other | Admitting: Podiatry

## 2021-04-06 ENCOUNTER — Other Ambulatory Visit: Payer: Self-pay

## 2021-04-06 ENCOUNTER — Encounter: Payer: Self-pay | Admitting: Podiatry

## 2021-04-06 DIAGNOSIS — B351 Tinea unguium: Secondary | ICD-10-CM

## 2021-04-06 DIAGNOSIS — M79674 Pain in right toe(s): Secondary | ICD-10-CM

## 2021-04-06 DIAGNOSIS — M79675 Pain in left toe(s): Secondary | ICD-10-CM | POA: Diagnosis not present

## 2021-04-06 DIAGNOSIS — L84 Corns and callosities: Secondary | ICD-10-CM | POA: Diagnosis not present

## 2021-04-06 DIAGNOSIS — E1140 Type 2 diabetes mellitus with diabetic neuropathy, unspecified: Secondary | ICD-10-CM

## 2021-04-06 NOTE — Progress Notes (Addendum)
This patient returns to my office for at risk foot care.  This patient requires this care by a professional since this patient will be at risk due to having diabetes.   This patient is unable to cut nails himself since the patient cannot reach his nails.These nails are painful walking and wearing shoes. Patient has painful corn second toe right.  This patient presents for at risk foot care today.  General Appearance  Alert, conversant and in no acute stress.  Vascular  Dorsalis pedis and posterior tibial  pulses are palpable  bilaterally.  Capillary return is within normal limits  bilaterally. Temperature is within normal limits  bilaterally.  Neurologic  Senn-Weinstein monofilament wire test within normal limits  bilaterally. Muscle power within normal limits bilaterally.  Nails Thick disfigured discolored nails with subungual debris  from hallux to fifth toes bilaterally. No evidence of bacterial infection or drainage bilaterally.  Orthopedic  No limitations of motion  feet .  No crepitus or effusions noted.  Rigid hammer toes 2-4  B/L.  Skin  normotropic skin with no porokeratosis noted bilaterally.  No signs of infections or ulcers noted.   Clavi second toe right foot asymptomatic  Onychomycosis  Pain in right toes  Pain in left toes    Consent was obtained for treatment procedures.   Mechanical debridement of nails 1-5  bilaterally performed with a nail nipper.  Filed with dremel without incident. His asymptomatic clavi is worsening and Dr.  March Rummage was contacted for second opinion.  Dr.  March Rummage debrided his clavi and found pinhole ulcer.  Dr.  March Rummage will see him in one week for tenotomy second toe right foot.   Return office visit    3 months                  Told patient to return for periodic foot care and evaluation due to potential at risk complications.   Gardiner Barefoot DPM

## 2021-04-13 ENCOUNTER — Other Ambulatory Visit: Payer: Self-pay

## 2021-04-13 ENCOUNTER — Ambulatory Visit: Payer: Medicare Other | Admitting: Podiatry

## 2021-04-13 DIAGNOSIS — M2041 Other hammer toe(s) (acquired), right foot: Secondary | ICD-10-CM

## 2021-04-13 NOTE — Progress Notes (Signed)
  Subjective:  Patient ID: Eric Jones, male    DOB: 04-29-1941,  MRN: [id number]  Chief Complaint  Patient presents with  . fexor tenotomy    F/U for Rt 2nd toe flexor tenotomy -pt states same with redness and swelling - no pain Tx; none   . Diabetes    FBS: 154 A1C: 8.2   80 y.o. male returns today for planned flexor tenotomy of the right 2nd  digit.  Objective:  There were no vitals filed for this visit.  General AA&O x3. Normal mood and affect.  Vascular Pedal pulses palpable.  Neurologic Epicritic sensation grossly intact.  Dermatologic Pre-ulcerative callus at the tip of the right, 2nd toe  Orthopedic: Semi-reducible hammertoe deformity right, 2nd toe    Assessment & Plan:  Patient was evaluated and treated and all questions answered.  Hammertoe right 2nd toe with pre-ulcerative callus -Flexor tenotomy as below. -Advised to remove the dressing in 24 hours and apply a band-aid and triple abx ointment every day thereafter.  Procedure: Flexor Tenotomy Indication for Procedure: toe with semi-reducible hammertoe with distal tip ulceration. Flexor tenotomy indicated to alleviate contracture, reduce pressure, and enhance healing of the ulceration. Location: right, 2nd toe Anesthesia: Lidocaine 1% plain; 1.5 mL and Marcaine 0.5% plain; 1.5 mL digital block Instrumentation: 18 g needle  Technique: The toe was anesthetized as above and prepped in the usual fashion. The toe was exsanquinated and a tourniquet was secured at the base of the toe. An 18g needle was then used to percutaneously release the flexor tendon at the plantar surface of the toe with noted release of the hammertoe deformity. The incision was then dressed with antibiotic ointment and band-aid. Compression splint dressing applied. Patient tolerated the procedure well. Dressing: Dry, sterile, compression dressing. Disposition: Patient tolerated procedure well. Patient to return in 1 week for follow-up.   Return  in about 2 weeks (around 04/27/2021) for Post-Op (No XRs).

## 2021-04-27 ENCOUNTER — Ambulatory Visit: Payer: Medicare Other | Admitting: Podiatry

## 2021-04-27 ENCOUNTER — Other Ambulatory Visit: Payer: Self-pay

## 2021-04-27 DIAGNOSIS — M2041 Other hammer toe(s) (acquired), right foot: Secondary | ICD-10-CM

## 2021-04-27 NOTE — Progress Notes (Signed)
  Subjective:  Patient ID: Eric Jones, male    DOB: [birth year],  MRN: [id number]  Chief Complaint  Patient presents with  . Hammer Toe    Right 2nd digit healing well post flexor tenotomy. Denies fever/chills/nausea/vomiting. No concerns.  . Nail Problem    Right 2nd toenail trim   DOS: 04/13/21 Procedure: Flexor tenotomy right 2nd toe  80 y.o. male presents with the above complaint. History confirmed with patient.   Objective:  Physical Exam: no tenderness at the surgical site and no edema noted. Toe rectus with callus facing distally. Incision: healed.   Assessment:   1. Hammertoe of right foot    Plan:  Patient was evaluated and treated and all questions answered.  Post-operative State -Debrided HPK, courtesy -Continue normal shoegear -If callus recurs would have to consider hammertoe correction however I think this will resolve with time.  Return in about 4 weeks (around 05/25/2021) for Post-Op (No XRs).

## 2021-05-04 DIAGNOSIS — R059 Cough, unspecified: Secondary | ICD-10-CM | POA: Diagnosis not present

## 2021-05-04 DIAGNOSIS — Z03818 Encounter for observation for suspected exposure to other biological agents ruled out: Secondary | ICD-10-CM | POA: Diagnosis not present

## 2021-05-04 DIAGNOSIS — J40 Bronchitis, not specified as acute or chronic: Secondary | ICD-10-CM | POA: Diagnosis not present

## 2021-05-29 ENCOUNTER — Ambulatory Visit: Payer: Medicare Other | Admitting: Podiatry

## 2021-05-29 ENCOUNTER — Other Ambulatory Visit: Payer: Self-pay

## 2021-06-05 ENCOUNTER — Other Ambulatory Visit: Payer: Self-pay

## 2021-06-05 ENCOUNTER — Ambulatory Visit: Payer: Medicare Other | Admitting: Podiatry

## 2021-06-05 DIAGNOSIS — M2041 Other hammer toe(s) (acquired), right foot: Secondary | ICD-10-CM

## 2021-06-05 NOTE — Progress Notes (Signed)
  Subjective:  Patient ID: Eric Jones, male    DOB: [birth year],  MRN: [id number]  Chief Complaint  Patient presents with   Routine Post Op    Pt deneis N/V/F/Ch -pt states," doing good, no problem at all." - no pain/redness -little swelling tx: none   Diabetes    FBS: 154 a1C: 8.1   DOS: 04/13/21 Procedure: Flexor tenotomy right 2nd toe  80 y.o. male presents with the above complaint. History confirmed with patient.   Objective:  Physical Exam: no tenderness at the surgical site and no edema noted. Toe rectus with callus facing distally. Incision: healed.   Assessment:   1. Hammertoe of right foot     Plan:  Patient was evaluated and treated and all questions answered.  Post-operative State -No recurrence of callus noted. Good positioning noted of the toe. Should this not alleviate patient's concern will have to consider arthroplasty.  No follow-ups on file.

## 2021-06-12 DIAGNOSIS — D0472 Carcinoma in situ of skin of left lower limb, including hip: Secondary | ICD-10-CM | POA: Diagnosis not present

## 2021-06-12 DIAGNOSIS — L570 Actinic keratosis: Secondary | ICD-10-CM | POA: Diagnosis not present

## 2021-06-12 DIAGNOSIS — N182 Chronic kidney disease, stage 2 (mild): Secondary | ICD-10-CM | POA: Diagnosis not present

## 2021-06-12 DIAGNOSIS — L814 Other melanin hyperpigmentation: Secondary | ICD-10-CM | POA: Diagnosis not present

## 2021-06-12 DIAGNOSIS — L821 Other seborrheic keratosis: Secondary | ICD-10-CM | POA: Diagnosis not present

## 2021-06-21 DIAGNOSIS — R609 Edema, unspecified: Secondary | ICD-10-CM | POA: Diagnosis not present

## 2021-06-21 DIAGNOSIS — I129 Hypertensive chronic kidney disease with stage 1 through stage 4 chronic kidney disease, or unspecified chronic kidney disease: Secondary | ICD-10-CM | POA: Diagnosis not present

## 2021-06-21 DIAGNOSIS — E1122 Type 2 diabetes mellitus with diabetic chronic kidney disease: Secondary | ICD-10-CM | POA: Diagnosis not present

## 2021-06-21 DIAGNOSIS — N182 Chronic kidney disease, stage 2 (mild): Secondary | ICD-10-CM | POA: Diagnosis not present

## 2021-06-21 DIAGNOSIS — R809 Proteinuria, unspecified: Secondary | ICD-10-CM | POA: Diagnosis not present

## 2021-07-20 ENCOUNTER — Encounter: Payer: Self-pay | Admitting: Podiatry

## 2021-07-20 ENCOUNTER — Ambulatory Visit: Payer: Medicare Other | Admitting: Podiatry

## 2021-07-20 ENCOUNTER — Other Ambulatory Visit: Payer: Self-pay

## 2021-07-20 DIAGNOSIS — M79675 Pain in left toe(s): Secondary | ICD-10-CM | POA: Diagnosis not present

## 2021-07-20 DIAGNOSIS — M79674 Pain in right toe(s): Secondary | ICD-10-CM

## 2021-07-20 DIAGNOSIS — E1140 Type 2 diabetes mellitus with diabetic neuropathy, unspecified: Secondary | ICD-10-CM

## 2021-07-20 DIAGNOSIS — B351 Tinea unguium: Secondary | ICD-10-CM | POA: Diagnosis not present

## 2021-07-20 NOTE — Progress Notes (Signed)
This patient returns to my office for at risk foot care.  This patient requires this care by a professional since this patient will be at risk due to having diabetes.   This patient is unable to cut nails himself since the patient cannot reach his nails.These nails are painful walking and wearing shoes.  This patient presents for at risk foot care today.  General Appearance  Alert, conversant and in no acute stress.  Vascular  Dorsalis pedis and posterior tibial  pulses are palpable  bilaterally.  Capillary return is within normal limits  bilaterally. Temperature is within normal limits  bilaterally.  Neurologic  Senn-Weinstein monofilament wire test within normal limits  bilaterally. Muscle power within normal limits bilaterally.  Nails Thick disfigured discolored nails with subungual debris  from hallux to fifth toes bilaterally. No evidence of bacterial infection or drainage bilaterally.  Orthopedic  No limitations of motion  feet .  No crepitus or effusions noted.  Rigid hammer toes 2-4  B/L.  Skin  normotropic skin with no porokeratosis noted bilaterally.  No signs of infections or ulcers noted.     Onychomycosis  Pain in right toes  Pain in left toes    Consent was obtained for treatment procedures.   Mechanical debridement of nails 1-5  bilaterally performed with a nail nipper.  Filed with dremel without incident.     Return office visit    3 months                  Told patient to return for periodic foot care and evaluation due to potential at risk complications.   Gardiner Barefoot DPM

## 2021-08-31 ENCOUNTER — Other Ambulatory Visit: Payer: Self-pay

## 2021-08-31 ENCOUNTER — Ambulatory Visit: Payer: Medicare Other | Admitting: Podiatry

## 2021-08-31 ENCOUNTER — Ambulatory Visit (INDEPENDENT_AMBULATORY_CARE_PROVIDER_SITE_OTHER): Payer: Medicare Other

## 2021-08-31 ENCOUNTER — Other Ambulatory Visit: Payer: Self-pay | Admitting: Podiatry

## 2021-08-31 DIAGNOSIS — M21622 Bunionette of left foot: Secondary | ICD-10-CM

## 2021-08-31 DIAGNOSIS — M79672 Pain in left foot: Secondary | ICD-10-CM

## 2021-08-31 MED ORDER — SILVER SULFADIAZINE 1 % EX CREA: TOPICAL_CREAM | CUTANEOUS | 0 refills | Status: AC

## 2021-08-31 NOTE — Progress Notes (Signed)
  Subjective:  Patient ID: Eric Jones, male    DOB: Aug 26, 1941,  MRN: [id number]  Chief Complaint  Patient presents with   Bunions    Left foot bunion pain 1 month duration   80 y.o. male presents with the above complaint. History confirmed with patient.  Having pain to the outside of the left foot and has a blistered area date could be secondary to shoe gear  Objective:  Physical Exam: warm, good capillary refill and normal DP and PT pulses. Left Foot: small left lateral 5th metatarsal base wound measuring 0.5x0.3 without warmth, erythema, SOI.     No images are attached to the encounter.  Radiographs: X-ray of the left foot: midfoot degenerative changes. No acute fractures. Tailor's bunion deformity 5th metatarsal. Assessment:   1. Pain in left foot    Plan:  Patient was evaluated and treated and all questions answered.  Tailor's bunion -Educated on etiology -Discussed shoe gear changes to prevent recurrence of pain.  Wound left lateral foot -Small superficial ulceration noted. Gently debrided with 15 blade. SSD and band-aid applied. Continue daily. F/u in 1 month if not healed at that time  No follow-ups on file.

## 2021-09-26 DIAGNOSIS — E7800 Pure hypercholesterolemia, unspecified: Secondary | ICD-10-CM | POA: Diagnosis not present

## 2021-09-26 DIAGNOSIS — H6123 Impacted cerumen, bilateral: Secondary | ICD-10-CM | POA: Diagnosis not present

## 2021-09-26 DIAGNOSIS — Z23 Encounter for immunization: Secondary | ICD-10-CM | POA: Diagnosis not present

## 2021-09-26 DIAGNOSIS — I10 Essential (primary) hypertension: Secondary | ICD-10-CM | POA: Diagnosis not present

## 2021-09-26 DIAGNOSIS — E1165 Type 2 diabetes mellitus with hyperglycemia: Secondary | ICD-10-CM | POA: Diagnosis not present

## 2021-10-24 ENCOUNTER — Ambulatory Visit (INDEPENDENT_AMBULATORY_CARE_PROVIDER_SITE_OTHER): Payer: Medicare Other | Admitting: Podiatry

## 2021-10-24 ENCOUNTER — Other Ambulatory Visit: Payer: Self-pay

## 2021-10-24 ENCOUNTER — Encounter: Payer: Self-pay | Admitting: Podiatry

## 2021-10-24 DIAGNOSIS — B351 Tinea unguium: Secondary | ICD-10-CM

## 2021-10-24 DIAGNOSIS — M79675 Pain in left toe(s): Secondary | ICD-10-CM | POA: Diagnosis not present

## 2021-10-24 DIAGNOSIS — M79674 Pain in right toe(s): Secondary | ICD-10-CM | POA: Diagnosis not present

## 2021-10-24 DIAGNOSIS — E1140 Type 2 diabetes mellitus with diabetic neuropathy, unspecified: Secondary | ICD-10-CM | POA: Diagnosis not present

## 2021-10-24 NOTE — Progress Notes (Signed)
This patient returns to my office for at risk foot care.  This patient requires this care by a professional since this patient will be at risk due to having diabetes.   This patient is unable to cut nails himself since the patient cannot reach his nails.These nails are painful walking and wearing shoes.  This patient presents for at risk foot care today.  General Appearance  Alert, conversant and in no acute stress.  Vascular  Dorsalis pedis and posterior tibial  pulses are palpable  bilaterally.  Capillary return is within normal limits  bilaterally. Temperature is within normal limits  bilaterally.  Neurologic  Senn-Weinstein monofilament wire test within normal limits  bilaterally. Muscle power within normal limits bilaterally.  Nails Thick disfigured discolored nails with subungual debris  from hallux to fifth toes bilaterally. No evidence of bacterial infection or drainage bilaterally.  Orthopedic  No limitations of motion  feet .  No crepitus or effusions noted.  Rigid hammer toes 2-4  B/L.  Skin  normotropic skin with no porokeratosis noted bilaterally.  No signs of infections or ulcers noted.     Onychomycosis  Pain in right toes  Pain in left toes    Consent was obtained for treatment procedures.   Mechanical debridement of nails 1-5  bilaterally performed with a nail nipper.  Filed with dremel without incident.     Return office visit    3 months                  Told patient to return for periodic foot care and evaluation due to potential at risk complications.   Gardiner Barefoot DPM

## 2021-12-06 DIAGNOSIS — N182 Chronic kidney disease, stage 2 (mild): Secondary | ICD-10-CM | POA: Diagnosis not present

## 2021-12-10 DIAGNOSIS — J0101 Acute recurrent maxillary sinusitis: Secondary | ICD-10-CM | POA: Diagnosis not present

## 2021-12-13 DIAGNOSIS — N182 Chronic kidney disease, stage 2 (mild): Secondary | ICD-10-CM | POA: Diagnosis not present

## 2021-12-13 DIAGNOSIS — R609 Edema, unspecified: Secondary | ICD-10-CM | POA: Diagnosis not present

## 2021-12-13 DIAGNOSIS — I129 Hypertensive chronic kidney disease with stage 1 through stage 4 chronic kidney disease, or unspecified chronic kidney disease: Secondary | ICD-10-CM | POA: Diagnosis not present

## 2021-12-13 DIAGNOSIS — E1122 Type 2 diabetes mellitus with diabetic chronic kidney disease: Secondary | ICD-10-CM | POA: Diagnosis not present

## 2021-12-13 DIAGNOSIS — R809 Proteinuria, unspecified: Secondary | ICD-10-CM | POA: Diagnosis not present

## 2022-01-11 DIAGNOSIS — I10 Essential (primary) hypertension: Secondary | ICD-10-CM | POA: Diagnosis not present

## 2022-01-11 DIAGNOSIS — E1165 Type 2 diabetes mellitus with hyperglycemia: Secondary | ICD-10-CM | POA: Diagnosis not present

## 2022-01-11 DIAGNOSIS — E7800 Pure hypercholesterolemia, unspecified: Secondary | ICD-10-CM | POA: Diagnosis not present

## 2022-01-22 DIAGNOSIS — H43811 Vitreous degeneration, right eye: Secondary | ICD-10-CM | POA: Diagnosis not present

## 2022-01-22 DIAGNOSIS — E113213 Type 2 diabetes mellitus with mild nonproliferative diabetic retinopathy with macular edema, bilateral: Secondary | ICD-10-CM | POA: Diagnosis not present

## 2022-01-22 DIAGNOSIS — Z961 Presence of intraocular lens: Secondary | ICD-10-CM | POA: Diagnosis not present

## 2022-01-25 ENCOUNTER — Ambulatory Visit: Payer: Medicare Other | Admitting: Podiatry

## 2022-02-27 DIAGNOSIS — H35372 Puckering of macula, left eye: Secondary | ICD-10-CM | POA: Diagnosis not present

## 2022-02-27 DIAGNOSIS — E113313 Type 2 diabetes mellitus with moderate nonproliferative diabetic retinopathy with macular edema, bilateral: Secondary | ICD-10-CM | POA: Diagnosis not present

## 2022-02-27 DIAGNOSIS — H43813 Vitreous degeneration, bilateral: Secondary | ICD-10-CM | POA: Diagnosis not present

## 2022-02-27 DIAGNOSIS — H31092 Other chorioretinal scars, left eye: Secondary | ICD-10-CM | POA: Diagnosis not present

## 2022-03-04 ENCOUNTER — Ambulatory Visit (INDEPENDENT_AMBULATORY_CARE_PROVIDER_SITE_OTHER): Payer: Medicare Other | Admitting: Podiatry

## 2022-03-04 ENCOUNTER — Encounter: Payer: Self-pay | Admitting: Podiatry

## 2022-03-04 DIAGNOSIS — M79674 Pain in right toe(s): Secondary | ICD-10-CM

## 2022-03-04 DIAGNOSIS — B351 Tinea unguium: Secondary | ICD-10-CM | POA: Diagnosis not present

## 2022-03-04 DIAGNOSIS — E1140 Type 2 diabetes mellitus with diabetic neuropathy, unspecified: Secondary | ICD-10-CM | POA: Diagnosis not present

## 2022-03-04 DIAGNOSIS — M79675 Pain in left toe(s): Secondary | ICD-10-CM | POA: Diagnosis not present

## 2022-03-04 NOTE — Progress Notes (Signed)
This patient returns to my office for at risk foot care.  This patient requires this care by a professional since this patient will be at risk due to having diabetes.   This patient is unable to cut nails himself since the patient cannot reach his nails.These nails are painful walking and wearing shoes.  This patient presents for at risk foot care today. ? ?General Appearance  Alert, conversant and in no acute stress. ? ?Vascular  Dorsalis pedis and posterior tibial  pulses are palpable  bilaterally.  Capillary return is within normal limits  bilaterally. Temperature is within normal limits  bilaterally. ? ?Neurologic  Senn-Weinstein monofilament wire test within normal limits  bilaterally. Muscle power within normal limits bilaterally. ? ?Nails Thick disfigured discolored nails with subungual debris  from hallux to fifth toes bilaterally. No evidence of bacterial infection or drainage bilaterally. ? ?Orthopedic  No limitations of motion  feet .  No crepitus or effusions noted.  Rigid hammer toes 2-4  B/L. ? ?Skin  normotropic skin with no porokeratosis noted bilaterally.  No signs of infections or ulcers noted.    ? ?Onychomycosis  Pain in right toes  Pain in left toes   ? ?Consent was obtained for treatment procedures.   Mechanical debridement of nails 1-5  bilaterally performed with a nail nipper.  Filed with dremel without incident.   ? ? ?Return office visit    3 months                  Told patient to return for periodic foot care and evaluation due to potential at risk complications. ? ? ?Gardiner Barefoot DPM  ?

## 2022-03-05 DIAGNOSIS — E113312 Type 2 diabetes mellitus with moderate nonproliferative diabetic retinopathy with macular edema, left eye: Secondary | ICD-10-CM | POA: Diagnosis not present

## 2022-04-03 DIAGNOSIS — H35372 Puckering of macula, left eye: Secondary | ICD-10-CM | POA: Diagnosis not present

## 2022-04-03 DIAGNOSIS — H43391 Other vitreous opacities, right eye: Secondary | ICD-10-CM | POA: Diagnosis not present

## 2022-04-03 DIAGNOSIS — H43813 Vitreous degeneration, bilateral: Secondary | ICD-10-CM | POA: Diagnosis not present

## 2022-04-03 DIAGNOSIS — E113313 Type 2 diabetes mellitus with moderate nonproliferative diabetic retinopathy with macular edema, bilateral: Secondary | ICD-10-CM | POA: Diagnosis not present

## 2022-04-04 DIAGNOSIS — E1165 Type 2 diabetes mellitus with hyperglycemia: Secondary | ICD-10-CM | POA: Diagnosis not present

## 2022-04-04 DIAGNOSIS — E1121 Type 2 diabetes mellitus with diabetic nephropathy: Secondary | ICD-10-CM | POA: Diagnosis not present

## 2022-04-04 DIAGNOSIS — I10 Essential (primary) hypertension: Secondary | ICD-10-CM | POA: Diagnosis not present

## 2022-04-04 DIAGNOSIS — M1990 Unspecified osteoarthritis, unspecified site: Secondary | ICD-10-CM | POA: Diagnosis not present

## 2022-04-04 DIAGNOSIS — E7800 Pure hypercholesterolemia, unspecified: Secondary | ICD-10-CM | POA: Diagnosis not present

## 2022-04-04 DIAGNOSIS — Z0000 Encounter for general adult medical examination without abnormal findings: Secondary | ICD-10-CM | POA: Diagnosis not present

## 2022-06-07 ENCOUNTER — Ambulatory Visit: Payer: Medicare Other | Admitting: Podiatry

## 2022-06-10 DIAGNOSIS — N182 Chronic kidney disease, stage 2 (mild): Secondary | ICD-10-CM | POA: Diagnosis not present

## 2022-06-12 DIAGNOSIS — N182 Chronic kidney disease, stage 2 (mild): Secondary | ICD-10-CM | POA: Diagnosis not present

## 2022-06-12 DIAGNOSIS — I129 Hypertensive chronic kidney disease with stage 1 through stage 4 chronic kidney disease, or unspecified chronic kidney disease: Secondary | ICD-10-CM | POA: Diagnosis not present

## 2022-06-12 DIAGNOSIS — E1122 Type 2 diabetes mellitus with diabetic chronic kidney disease: Secondary | ICD-10-CM | POA: Diagnosis not present

## 2022-06-12 DIAGNOSIS — R609 Edema, unspecified: Secondary | ICD-10-CM | POA: Diagnosis not present

## 2022-06-12 DIAGNOSIS — R809 Proteinuria, unspecified: Secondary | ICD-10-CM | POA: Diagnosis not present

## 2022-06-19 ENCOUNTER — Encounter: Payer: Self-pay | Admitting: Podiatry

## 2022-06-19 ENCOUNTER — Ambulatory Visit (INDEPENDENT_AMBULATORY_CARE_PROVIDER_SITE_OTHER): Payer: Medicare Other | Admitting: Podiatry

## 2022-06-19 DIAGNOSIS — B351 Tinea unguium: Secondary | ICD-10-CM

## 2022-06-19 DIAGNOSIS — M79675 Pain in left toe(s): Secondary | ICD-10-CM | POA: Diagnosis not present

## 2022-06-19 DIAGNOSIS — M79674 Pain in right toe(s): Secondary | ICD-10-CM

## 2022-06-19 DIAGNOSIS — E1140 Type 2 diabetes mellitus with diabetic neuropathy, unspecified: Secondary | ICD-10-CM

## 2022-06-19 DIAGNOSIS — L84 Corns and callosities: Secondary | ICD-10-CM | POA: Diagnosis not present

## 2022-06-19 NOTE — Progress Notes (Signed)
This patient returns to my office for at risk foot care.  This patient requires this care by a professional since this patient will be at risk due to having diabetes.   This patient is unable to cut nails himself since the patient cannot reach his nails.These nails are painful walking and wearing shoes.  This patient presents for at risk foot care today.  General Appearance  Alert, conversant and in no acute stress.  Vascular  Dorsalis pedis and posterior tibial  pulses are palpable  bilaterally.  Capillary return is within normal limits  bilaterally. Temperature is within normal limits  bilaterally.  Neurologic  Senn-Weinstein monofilament wire test within normal limits  bilaterally. Muscle power within normal limits bilaterally.  Nails Thick disfigured discolored nails with subungual debris  from hallux to fifth toes bilaterally. No evidence of bacterial infection or drainage bilaterally. Patient has developed a blister at proximal nail fold third toe right foot..  Orthopedic  No limitations of motion  feet .  No crepitus or effusions noted.  Rigid hammer toes 2-4  B/L.  Skin  normotropic skin with no porokeratosis noted bilaterally.  No signs of infections or ulcers noted.  Clavi 3rd toe right foot.   Onychomycosis  Pain in right toes  Pain in left toes    Consent was obtained for treatment procedures.   Mechanical debridement of nails 1-5  bilaterally performed with a nail nipper.  Filed with dremel without incident.     Return office visit    3 months                  Told patient to return for periodic foot care and evaluation due to potential at risk complications.   Gardiner Barefoot DPM

## 2022-07-10 DIAGNOSIS — H31092 Other chorioretinal scars, left eye: Secondary | ICD-10-CM | POA: Diagnosis not present

## 2022-07-10 DIAGNOSIS — H43813 Vitreous degeneration, bilateral: Secondary | ICD-10-CM | POA: Diagnosis not present

## 2022-07-10 DIAGNOSIS — E113313 Type 2 diabetes mellitus with moderate nonproliferative diabetic retinopathy with macular edema, bilateral: Secondary | ICD-10-CM | POA: Diagnosis not present

## 2022-07-15 DIAGNOSIS — E1165 Type 2 diabetes mellitus with hyperglycemia: Secondary | ICD-10-CM | POA: Diagnosis not present

## 2022-07-15 DIAGNOSIS — E7800 Pure hypercholesterolemia, unspecified: Secondary | ICD-10-CM | POA: Diagnosis not present

## 2022-07-15 DIAGNOSIS — I10 Essential (primary) hypertension: Secondary | ICD-10-CM | POA: Diagnosis not present

## 2022-08-26 DIAGNOSIS — J0181 Other acute recurrent sinusitis: Secondary | ICD-10-CM | POA: Diagnosis not present

## 2022-09-04 DIAGNOSIS — Z23 Encounter for immunization: Secondary | ICD-10-CM | POA: Diagnosis not present

## 2022-09-27 ENCOUNTER — Encounter: Payer: Self-pay | Admitting: Podiatry

## 2022-09-27 ENCOUNTER — Ambulatory Visit (INDEPENDENT_AMBULATORY_CARE_PROVIDER_SITE_OTHER): Payer: Medicare Other | Admitting: Podiatry

## 2022-09-27 DIAGNOSIS — M79675 Pain in left toe(s): Secondary | ICD-10-CM | POA: Diagnosis not present

## 2022-09-27 DIAGNOSIS — E1140 Type 2 diabetes mellitus with diabetic neuropathy, unspecified: Secondary | ICD-10-CM | POA: Diagnosis not present

## 2022-09-27 DIAGNOSIS — B351 Tinea unguium: Secondary | ICD-10-CM | POA: Diagnosis not present

## 2022-09-27 DIAGNOSIS — M79674 Pain in right toe(s): Secondary | ICD-10-CM | POA: Diagnosis not present

## 2022-09-27 NOTE — Progress Notes (Signed)
This patient returns to my office for at risk foot care.  This patient requires this care by a professional since this patient will be at risk due to having diabetes.   This patient is unable to cut nails himself since the patient cannot reach his nails.These nails are painful walking and wearing shoes.  This patient presents for at risk foot care today.  General Appearance  Alert, conversant and in no acute stress.  Vascular  Dorsalis pedis and posterior tibial  pulses are palpable  bilaterally.  Capillary return is within normal limits  bilaterally. Temperature is within normal limits  bilaterally.  Neurologic  Senn-Weinstein monofilament wire test within normal limits  bilaterally. Muscle power within normal limits bilaterally.  Nails Thick disfigured discolored nails with subungual debris  from hallux to fifth toes bilaterally. No evidence of bacterial infection or drainage bilaterally. Patient has developed a blister at proximal nail fold third toe right foot..  Orthopedic  No limitations of motion  feet .  No crepitus or effusions noted.  Rigid hammer toes 2-4  B/L.  Skin  normotropic skin with no porokeratosis noted bilaterally.  No signs of infections or ulcers noted.  Clavi 3rd toe right foot.   Onychomycosis  Pain in right toes  Pain in left toes    Consent was obtained for treatment procedures.   Mechanical debridement of nails 1-5  bilaterally performed with a nail nipper.  Filed with dremel without incident.     Return office visit   10 weeks                 Told patient to return for periodic foot care and evaluation due to potential at risk complications.   Jeimy Bickert DPM  

## 2022-11-11 DIAGNOSIS — E1165 Type 2 diabetes mellitus with hyperglycemia: Secondary | ICD-10-CM | POA: Diagnosis not present

## 2022-12-11 ENCOUNTER — Ambulatory Visit: Payer: Medicare Other | Admitting: Podiatry

## 2022-12-12 DIAGNOSIS — R809 Proteinuria, unspecified: Secondary | ICD-10-CM | POA: Diagnosis not present

## 2022-12-12 DIAGNOSIS — I129 Hypertensive chronic kidney disease with stage 1 through stage 4 chronic kidney disease, or unspecified chronic kidney disease: Secondary | ICD-10-CM | POA: Diagnosis not present

## 2022-12-12 DIAGNOSIS — E1122 Type 2 diabetes mellitus with diabetic chronic kidney disease: Secondary | ICD-10-CM | POA: Diagnosis not present

## 2022-12-12 DIAGNOSIS — R609 Edema, unspecified: Secondary | ICD-10-CM | POA: Diagnosis not present

## 2022-12-12 DIAGNOSIS — N182 Chronic kidney disease, stage 2 (mild): Secondary | ICD-10-CM | POA: Diagnosis not present

## 2023-01-31 DIAGNOSIS — E113313 Type 2 diabetes mellitus with moderate nonproliferative diabetic retinopathy with macular edema, bilateral: Secondary | ICD-10-CM | POA: Diagnosis not present

## 2023-01-31 DIAGNOSIS — H43813 Vitreous degeneration, bilateral: Secondary | ICD-10-CM | POA: Diagnosis not present

## 2023-02-03 ENCOUNTER — Ambulatory Visit: Payer: Medicare Other | Admitting: Podiatry

## 2023-02-03 ENCOUNTER — Encounter: Payer: Self-pay | Admitting: Podiatry

## 2023-02-03 DIAGNOSIS — M79674 Pain in right toe(s): Secondary | ICD-10-CM

## 2023-02-03 DIAGNOSIS — B351 Tinea unguium: Secondary | ICD-10-CM

## 2023-02-03 DIAGNOSIS — E1140 Type 2 diabetes mellitus with diabetic neuropathy, unspecified: Secondary | ICD-10-CM

## 2023-02-03 DIAGNOSIS — M79675 Pain in left toe(s): Secondary | ICD-10-CM | POA: Diagnosis not present

## 2023-02-03 NOTE — Progress Notes (Signed)
This patient returns to my office for at risk foot care.  This patient requires this care by a professional since this patient will be at risk due to having diabetes.   This patient is unable to cut nails himself since the patient cannot reach his nails.These nails are painful walking and wearing shoes.  This patient presents for at risk foot care today.  General Appearance  Alert, conversant and in no acute stress.  Vascular  Dorsalis pedis and posterior tibial  pulses are palpable  bilaterally.  Capillary return is within normal limits  bilaterally. Temperature is within normal limits  bilaterally.  Neurologic  Senn-Weinstein monofilament wire test within normal limits  bilaterally. Muscle power within normal limits bilaterally.  Nails Thick disfigured discolored nails with subungual debris  from hallux to fifth toes bilaterally. No evidence of bacterial infection or drainage bilaterally. Patient has developed a blister at proximal nail fold third toe right foot..  Orthopedic  No limitations of motion  feet .  No crepitus or effusions noted.  Rigid hammer toes 2-4  B/L.  Skin  normotropic skin with no porokeratosis noted bilaterally.  No signs of infections or ulcers noted.  Clavi 3rd toe right foot.   Onychomycosis  Pain in right toes  Pain in left toes    Consent was obtained for treatment procedures.   Mechanical debridement of nails 1-5  bilaterally performed with a nail nipper.  Filed with dremel without incident.     Return office visit   10 weeks                 Told patient to return for periodic foot care and evaluation due to potential at risk complications.   Gardiner Barefoot DPM

## 2023-03-17 DIAGNOSIS — H6123 Impacted cerumen, bilateral: Secondary | ICD-10-CM | POA: Diagnosis not present

## 2023-04-15 DIAGNOSIS — H35372 Puckering of macula, left eye: Secondary | ICD-10-CM | POA: Diagnosis not present

## 2023-04-15 DIAGNOSIS — E113313 Type 2 diabetes mellitus with moderate nonproliferative diabetic retinopathy with macular edema, bilateral: Secondary | ICD-10-CM | POA: Diagnosis not present

## 2023-04-15 DIAGNOSIS — H43813 Vitreous degeneration, bilateral: Secondary | ICD-10-CM | POA: Diagnosis not present

## 2023-04-17 ENCOUNTER — Ambulatory Visit (INDEPENDENT_AMBULATORY_CARE_PROVIDER_SITE_OTHER): Payer: Medicare Other | Admitting: Podiatry

## 2023-04-17 ENCOUNTER — Encounter: Payer: Self-pay | Admitting: Podiatry

## 2023-04-17 DIAGNOSIS — M79674 Pain in right toe(s): Secondary | ICD-10-CM | POA: Diagnosis not present

## 2023-04-17 DIAGNOSIS — E1140 Type 2 diabetes mellitus with diabetic neuropathy, unspecified: Secondary | ICD-10-CM | POA: Diagnosis not present

## 2023-04-17 DIAGNOSIS — M79675 Pain in left toe(s): Secondary | ICD-10-CM

## 2023-04-17 DIAGNOSIS — L84 Corns and callosities: Secondary | ICD-10-CM | POA: Diagnosis not present

## 2023-04-17 DIAGNOSIS — B351 Tinea unguium: Secondary | ICD-10-CM

## 2023-04-17 NOTE — Progress Notes (Signed)
This patient returns to my office for at risk foot care.  This patient requires this care by a professional since this patient will be at risk due to having diabetes.   This patient is unable to cut nails himself since the patient cannot reach his nails.These nails are painful walking and wearing shoes.  This patient presents for at risk foot care today.  General Appearance  Alert, conversant and in no acute stress.  Vascular  Dorsalis pedis and posterior tibial  pulses are palpable  bilaterally.  Capillary return is within normal limits  bilaterally. Temperature is within normal limits  bilaterally.  Neurologic  Senn-Weinstein monofilament wire test within normal limits  bilaterally. Muscle power within normal limits bilaterally.  Nails Thick disfigured discolored nails with subungual debris  from hallux to fifth toes bilaterally. No evidence of bacterial infection or drainage bilaterally. Patient has developed a blister at proximal nail fold third toe right foot..  Orthopedic  No limitations of motion  feet .  No crepitus or effusions noted.  Rigid hammer toes 2-4  B/L.  Skin  normotropic skin with no porokeratosis noted bilaterally.  No signs of infections or ulcers noted.  Clavi 3rd toe right foot. Small open wound healing.  Onychomycosis  Pain in right toes  Pain in left toes    Consent was obtained for treatment procedures.   Mechanical debridement of nails 1-5  bilaterally performed with a nail nipper.  Filed with dremel without incident.  Padding third toe right foot.  To consider possible tenotomy 3rd toe right foot.   Return office visit   10 weeks                 Told patient to return for periodic foot care and evaluation due to potential at risk complications.   Helane Gunther DPM

## 2023-07-14 ENCOUNTER — Ambulatory Visit (INDEPENDENT_AMBULATORY_CARE_PROVIDER_SITE_OTHER): Payer: Medicare Other | Admitting: Podiatry

## 2023-07-14 ENCOUNTER — Encounter: Payer: Self-pay | Admitting: Podiatry

## 2023-07-14 DIAGNOSIS — M79674 Pain in right toe(s): Secondary | ICD-10-CM

## 2023-07-14 DIAGNOSIS — E1140 Type 2 diabetes mellitus with diabetic neuropathy, unspecified: Secondary | ICD-10-CM | POA: Diagnosis not present

## 2023-07-14 DIAGNOSIS — L84 Corns and callosities: Secondary | ICD-10-CM

## 2023-07-14 DIAGNOSIS — M2041 Other hammer toe(s) (acquired), right foot: Secondary | ICD-10-CM

## 2023-07-14 DIAGNOSIS — M79675 Pain in left toe(s): Secondary | ICD-10-CM

## 2023-07-14 DIAGNOSIS — B351 Tinea unguium: Secondary | ICD-10-CM | POA: Diagnosis not present

## 2023-07-14 NOTE — Progress Notes (Signed)
This patient returns to my office for at risk foot care.  This patient requires this care by a professional since this patient will be at risk due to having diabetes.   This patient is unable to cut nails himself since the patient cannot reach his nails.These nails are painful walking and wearing shoes.  This patient presents for at risk foot care today.  General Appearance  Alert, conversant and in no acute stress.  Vascular  Dorsalis pedis and posterior tibial  pulses are palpable  bilaterally.  Capillary return is within normal limits  bilaterally. Temperature is within normal limits  bilaterally.  Neurologic  Senn-Weinstein monofilament wire test within normal limits  bilaterally. Muscle power within normal limits bilaterally.  Nails Thick disfigured discolored nails with subungual debris  from hallux to fifth toes bilaterally. No evidence of bacterial infection or drainage bilaterally. Patient has developed a blister at proximal nail fold third toe right foot..  Orthopedic  No limitations of motion  feet .  No crepitus or effusions noted.  Rigid hammer toes 2-4  B/L.  Skin  normotropic skin with no porokeratosis noted bilaterally.  No signs of infections or ulcers noted.  Clavi 3rd toe right foot. Small open wound healing.  Onychomycosis  Pain in right toes  Pain in left toes    Consent was obtained for treatment procedures.   Mechanical debridement of nails 1-5  bilaterally performed with a nail nipper.  Filed with dremel without incident.    To consider possible tenotomy 3rd toe right foot.  Debride clavi 3rd toe right foot. Patient to make a surgical consult with Dr.  Allena Katz.   Return office visit   10 weeks                 Told patient to return for periodic foot care and evaluation due to potential at risk complications.   Helane Gunther DPM

## 2023-07-25 ENCOUNTER — Ambulatory Visit: Payer: Medicare Other | Admitting: Podiatry

## 2023-07-25 DIAGNOSIS — L539 Erythematous condition, unspecified: Secondary | ICD-10-CM | POA: Diagnosis not present

## 2023-07-25 DIAGNOSIS — L97511 Non-pressure chronic ulcer of other part of right foot limited to breakdown of skin: Secondary | ICD-10-CM

## 2023-07-25 DIAGNOSIS — E1140 Type 2 diabetes mellitus with diabetic neuropathy, unspecified: Secondary | ICD-10-CM | POA: Diagnosis not present

## 2023-07-25 MED ORDER — DOXYCYCLINE HYCLATE 100 MG PO TABS: 100.0000 mg | ORAL_TABLET | Freq: Two times a day (BID) | ORAL | 0 refills | Status: AC

## 2023-07-25 NOTE — Progress Notes (Signed)
Subjective:  Patient ID: Eric Jones, male    DOB: June 22, 1941,  MRN: [id number]  Chief Complaint  Patient presents with   Foot Ulcer    Right 3rd toe wound     82 y.o. male presents with the above complaint.  Patient presents with right third digit distal tip callus/ulceration.  Patient states been present for quite some time.  Patient was being treated by Dr. Stacie Acres.  He is a diabetic.  He wanted to get it evaluated discussed flexor tenotomy options.   Review of Systems: Negative except as noted in the HPI. Denies N/V/F/Ch.  Past Medical History:  Diagnosis Date   Cancer (HCC)    Diabetes mellitus without complication (HCC)    Hypertension     Current Outpatient Medications:    doxycycline (VIBRA-TABS) 100 MG tablet, Take 1 tablet (100 mg total) by mouth 2 (two) times daily for 10 days., Disp: 20 tablet, Rfl: 0   Accu-Chek Softclix Lancets lancets, SMARTSIG:Topical, Disp: , Rfl:    acetaminophen (TYLENOL) 500 MG tablet, Take 500 mg by mouth every 6 (six) hours as needed for mild pain. , Disp: , Rfl:    amLODipine (NORVASC) 10 MG tablet, Take 10 mg by mouth daily., Disp: , Rfl:    amoxicillin (AMOXIL) 500 MG capsule, Take 500 mg by mouth 3 (three) times daily., Disp: , Rfl:    aspirin 81 MG tablet, Take 81 mg by mouth daily., Disp: , Rfl:    atorvastatin (LIPITOR) 40 MG tablet, Take 40 mg by mouth daily., Disp: , Rfl:    azithromycin (ZITHROMAX) 250 MG tablet, Take by mouth., Disp: , Rfl:    benzonatate (TESSALON) 100 MG capsule, Take 1 capsule (100 mg total) by mouth every 8 (eight) hours., Disp: 21 capsule, Rfl: 0   diazepam (VALIUM) 2 MG tablet, Take 1 tablet (2 mg total) by mouth every 6 (six) hours as needed for muscle spasms., Disp: 15 tablet, Rfl: 0   FLOWFLEX COVID-19 AG HOME TEST KIT, REFER TO MANUFACTURER INSTRUCTIONS INCLUDED IN PACKAGING, Disp: , Rfl:    fluticasone (FLONASE) 50 MCG/ACT nasal spray, Place 1 spray into both nostrils as needed for allergies or  rhinitis., Disp: , Rfl:    furosemide (LASIX) 40 MG tablet, Take 40 mg by mouth daily., Disp: , Rfl:    glimepiride (AMARYL) 4 MG tablet, Take 4 mg by mouth 2 (two) times daily. , Disp: , Rfl:    glipiZIDE (GLUCOTROL XL) 10 MG 24 hr tablet, Take 10 mg by mouth every morning., Disp: , Rfl:    hydrochlorothiazide (HYDRODIURIL) 25 MG tablet, Take 25 mg by mouth daily., Disp: , Rfl:    Insulin Glargine (BASAGLAR KWIKPEN) 100 UNIT/ML SOPN, INJECT 10 UNITS SUBCUTANEOUSLY ONCE DAILY FOR 30 DAYS, Disp: , Rfl:    losartan (COZAAR) 100 MG tablet, TAKE 1 TABLET BY MOUTH ONCE DAILY FOR 90 DAYS, Disp: , Rfl:    morphine (MSIR) 15 MG tablet, TAKE 1 TABLET BY MOUTH AS NEEDED THREE TIMES A DAY AS NEEDED FOR POST FRACTURE PAIN FOR 5 DAYS, Disp: , Rfl: 0   mupirocin ointment (BACTROBAN) 2 %, Apply 1 application topically 3 (three) times daily., Disp: , Rfl:    oxyCODONE-acetaminophen (PERCOCET/ROXICET) 5-325 MG tablet, Take 2 tablets by mouth every 4 (four) hours as needed for severe pain., Disp: 15 tablet, Rfl: 0   pioglitazone (ACTOS) 45 MG tablet, TAKE 1 TABLET BY MOUTH ONCE DAILY FOR 90 DAYS, Disp: , Rfl:    silver sulfADIAZINE (SILVADENE)  1 % cream, Apply pea-sized amount to wound daily., Disp: 50 g, Rfl: 0   triamcinolone cream (KENALOG) 0.1 %, APPLY ON THE SKIN TWICE DAILY, Disp: , Rfl:   Social History   Tobacco Use  Smoking Status Never  Smokeless Tobacco Never    Allergies  Allergen Reactions   Metformin Hcl Other (See Comments)   Other Other (See Comments)   Objective:  There were no vitals filed for this visit. There is no height or weight on file to calculate BMI. Constitutional Well developed. Well nourished.  Vascular Dorsalis pedis pulses palpable bilaterally. Posterior tibial pulses palpable bilaterally. Capillary refill normal to all digits.  No cyanosis or clubbing noted. Pedal hair growth normal.  Neurologic Normal speech. Oriented to person, place, and time. Epicritic  sensation to light touch grossly present bilaterally.  Dermatologic Nails well groomed and normal in appearance. No open wounds. No skin lesions.  Orthopedic: Right third digit toe contracture semiflexible with distal tip ulceration limited to the breakdown of the skin.  Mild erythema noted.  No purulent drainage noted no malodor present   Radiographs: None Assessment:   1. Type 2 diabetes, controlled, with neuropathy (HCC)   2. Toe ulcer, right, limited to breakdown of skin (HCC)   3. Erythema    Plan:  Patient was evaluated and treated and all questions answered.  Right third digit ulcer limited to the breakdown of skin with underlying flexor contracture and erythema -All questions and concerns were discussed with the patient in extensive detail -Given the erythema present show he will benefit from doxycycline doxycycline was sent to the pharmacy -I discussed with the patient for the ulceration he would do iodine dressing once a day. -He will benefit from flexor tenotomy during next visit once the erythema has resolved  No follow-ups on file.

## 2023-08-15 ENCOUNTER — Ambulatory Visit: Payer: Medicare Other | Admitting: Podiatry

## 2023-08-15 DIAGNOSIS — M205X1 Other deformities of toe(s) (acquired), right foot: Secondary | ICD-10-CM

## 2023-08-15 NOTE — Progress Notes (Signed)
Subjective:  Patient ID: Eric Jones, male    DOB: 1941-06-17,  MRN: [id number]  Chief Complaint  Patient presents with   Toe Pain   82 y.o. male returns today for planned flexor tenotomy of the third digit.  Objective:  There were no vitals filed for this visit.  General AA&O x3. Normal mood and affect.  Vascular Pedal pulses palpable.  Neurologic Epicritic sensation grossly intact.  Dermatologic Pre-ulcerative callus at the tip of the right, 3rd toe  Orthopedic: Semi-reducible hammertoe deformity right, 3rd toe    Assessment & Plan:  Patient was evaluated and treated and all questions answered.  Hammertoe right third with pre-ulcerative callus -Flexor tenotomy as below. -Advised to remove the dressing in 24 hours and apply a band-aid and triple abx ointment every day thereafter.  Procedure: Flexor Tenotomy Indication for Procedure: toe with semi-reducible hammertoe with distal tip ulceration. Flexor tenotomy indicated to alleviate contracture, reduce pressure, and enhance healing of the ulceration. Location: right, 3rd toe Anesthesia: Lidocaine 1% plain; 1.5 mL and Marcaine 0.5% plain; 1.5 mL digital block Instrumentation: 18 g needle  Technique: The toe was anesthetized as above and prepped in the usual fashion. The toe was exsanquinated and a tourniquet was secured at the base of the toe. An 18g needle was then used to percutaneously release the flexor tendon at the plantar surface of the toe with noted release of the hammertoe deformity. The incision was then dressed with antibiotic ointment and band-aid. Compression splint dressing applied. Patient tolerated the procedure well. Dressing: Dry, sterile, compression dressing. Disposition: Patient tolerated procedure well. Patient to return in 1 week for follow-up.      No follow-ups on file.

## 2023-08-19 DIAGNOSIS — H43813 Vitreous degeneration, bilateral: Secondary | ICD-10-CM | POA: Diagnosis not present

## 2023-08-19 DIAGNOSIS — H31012 Macula scars of posterior pole (postinflammatory) (post-traumatic), left eye: Secondary | ICD-10-CM | POA: Diagnosis not present

## 2023-08-19 DIAGNOSIS — E113313 Type 2 diabetes mellitus with moderate nonproliferative diabetic retinopathy with macular edema, bilateral: Secondary | ICD-10-CM | POA: Diagnosis not present

## 2023-08-19 DIAGNOSIS — H35372 Puckering of macula, left eye: Secondary | ICD-10-CM | POA: Diagnosis not present

## 2023-08-29 ENCOUNTER — Ambulatory Visit: Payer: Medicare Other | Admitting: Podiatry

## 2023-08-29 DIAGNOSIS — M205X1 Other deformities of toe(s) (acquired), right foot: Secondary | ICD-10-CM

## 2023-08-29 NOTE — Progress Notes (Unsigned)
Subjective:  Patient ID: Eric Jones, male    DOB: [birth year],  MRN: [id number]  Chief Complaint  Patient presents with   Toe Pain   82 y.o. male returns today for follow-up for flexor tenotomy of the third digit.  Rectus alignment pathology.  Much better improvement of the distal tip callus noted  Objective:  There were no vitals filed for this visit.  General AA&O x3. Normal mood and affect.  Vascular Pedal pulses palpable.  Neurologic Epicritic sensation grossly intact.  Dermatologic No further pre-ulcerative callus at the tip of the right, 3rd toe  Orthopedic: No further semi-reducible hammertoe deformity right, 3rd toe    Assessment & Plan:  Patient was evaluated and treated and all questions answered.  Hammertoe right third with pre-ulcerative callus -Clinically healed.  Patient is officially discharged from my care if any foot and ankle issues arise future he will come back and see me.    No follow-ups on file.  Subjective:  Patient ID: Eric Jones, male    DOB: [birth year],  MRN: [id number]  Chief Complaint  Patient presents with   Toe Pain   82 y.o. male returns today for follow-up for flexor tenotomy of the third digit.  Rectus alignment pathology.  Much better improvement of the distal tip callus noted  Objective:  There were no vitals filed for this visit.  General AA&O x3. Normal mood and affect.  Vascular Pedal pulses palpable.  Neurologic Epicritic sensation grossly intact.  Dermatologic No further pre-ulcerative callus at the tip of the right, 3rd toe  Orthopedic: No further semi-reducible hammertoe deformity right, 3rd toe    Assessment & Plan:  Patient was evaluated and treated and all questions answered.  Hammertoe right third with pre-ulcerative callus -Clinically healed.  Patient is officially discharged from my care if any foot and ankle issues arise future he will come back and see me.    No follow-ups on file.

## 2023-09-02 DIAGNOSIS — E1165 Type 2 diabetes mellitus with hyperglycemia: Secondary | ICD-10-CM | POA: Diagnosis not present

## 2023-09-02 DIAGNOSIS — Z794 Long term (current) use of insulin: Secondary | ICD-10-CM | POA: Diagnosis not present

## 2023-09-02 DIAGNOSIS — M5414 Radiculopathy, thoracic region: Secondary | ICD-10-CM | POA: Diagnosis not present

## 2023-09-16 DIAGNOSIS — E113313 Type 2 diabetes mellitus with moderate nonproliferative diabetic retinopathy with macular edema, bilateral: Secondary | ICD-10-CM | POA: Diagnosis not present

## 2023-09-24 ENCOUNTER — Ambulatory Visit: Payer: Medicare Other | Admitting: Podiatry

## 2023-09-24 ENCOUNTER — Encounter: Payer: Self-pay | Admitting: Podiatry

## 2023-09-24 VITALS — Ht 70.5 in | Wt 271.2 lb

## 2023-09-24 DIAGNOSIS — M79675 Pain in left toe(s): Secondary | ICD-10-CM | POA: Diagnosis not present

## 2023-09-24 DIAGNOSIS — B351 Tinea unguium: Secondary | ICD-10-CM | POA: Diagnosis not present

## 2023-09-24 DIAGNOSIS — E1140 Type 2 diabetes mellitus with diabetic neuropathy, unspecified: Secondary | ICD-10-CM

## 2023-09-24 DIAGNOSIS — M79674 Pain in right toe(s): Secondary | ICD-10-CM | POA: Diagnosis not present

## 2023-09-24 NOTE — Progress Notes (Signed)
This patient returns to my office for at risk foot care.  This patient requires this care by a professional since this patient will be at risk due to having diabetes.   This patient is unable to cut nails himself since the patient cannot reach his nails.These nails are painful walking and wearing shoes.  This patient presents for at risk foot care today.  General Appearance  Alert, conversant and in no acute stress.  Vascular  Dorsalis pedis and posterior tibial  pulses are palpable  bilaterally.  Capillary return is within normal limits  bilaterally. Temperature is within normal limits  bilaterally.  Neurologic  Senn-Weinstein monofilament wire test within normal limits  bilaterally. Muscle power within normal limits bilaterally.  Nails Thick disfigured discolored nails with subungual debris  from hallux to fifth toes bilaterally. No evidence of bacterial infection or drainage bilaterally. Patient has developed a blister at proximal nail fold third toe right foot..  Orthopedic  No limitations of motion  feet .  No crepitus or effusions noted.  Rigid hammer toes 2-4  B/L.  Skin  normotropic skin with no porokeratosis noted bilaterally.  No signs of infections or ulcers noted.    Onychomycosis  Pain in right toes  Pain in left toes    Consent was obtained for treatment procedures.   Mechanical debridement of nails 1-5  bilaterally performed with a nail nipper.  Filed with dremel without incident.     Return office visit   12 weeks                 Told patient to return for periodic foot care and evaluation due to potential at risk complications.   Helane Gunther DPM

## 2023-11-11 DIAGNOSIS — H35372 Puckering of macula, left eye: Secondary | ICD-10-CM | POA: Diagnosis not present

## 2023-11-11 DIAGNOSIS — H31012 Macula scars of posterior pole (postinflammatory) (post-traumatic), left eye: Secondary | ICD-10-CM | POA: Diagnosis not present

## 2023-11-11 DIAGNOSIS — H43813 Vitreous degeneration, bilateral: Secondary | ICD-10-CM | POA: Diagnosis not present

## 2023-11-11 DIAGNOSIS — E113313 Type 2 diabetes mellitus with moderate nonproliferative diabetic retinopathy with macular edema, bilateral: Secondary | ICD-10-CM | POA: Diagnosis not present

## 2023-11-17 DIAGNOSIS — Z0000 Encounter for general adult medical examination without abnormal findings: Secondary | ICD-10-CM | POA: Diagnosis not present

## 2023-11-17 DIAGNOSIS — I10 Essential (primary) hypertension: Secondary | ICD-10-CM | POA: Diagnosis not present

## 2023-11-17 DIAGNOSIS — N390 Urinary tract infection, site not specified: Secondary | ICD-10-CM | POA: Diagnosis not present

## 2023-11-17 DIAGNOSIS — Z794 Long term (current) use of insulin: Secondary | ICD-10-CM | POA: Diagnosis not present

## 2023-11-17 DIAGNOSIS — E113213 Type 2 diabetes mellitus with mild nonproliferative diabetic retinopathy with macular edema, bilateral: Secondary | ICD-10-CM | POA: Diagnosis not present

## 2023-11-17 DIAGNOSIS — E7800 Pure hypercholesterolemia, unspecified: Secondary | ICD-10-CM | POA: Diagnosis not present

## 2023-11-17 DIAGNOSIS — E1165 Type 2 diabetes mellitus with hyperglycemia: Secondary | ICD-10-CM | POA: Diagnosis not present

## 2023-11-17 DIAGNOSIS — E1121 Type 2 diabetes mellitus with diabetic nephropathy: Secondary | ICD-10-CM | POA: Diagnosis not present

## 2023-11-17 DIAGNOSIS — Z9181 History of falling: Secondary | ICD-10-CM | POA: Diagnosis not present

## 2023-12-25 ENCOUNTER — Encounter: Payer: Self-pay | Admitting: Podiatry

## 2023-12-25 ENCOUNTER — Ambulatory Visit (INDEPENDENT_AMBULATORY_CARE_PROVIDER_SITE_OTHER): Payer: Medicare Other | Admitting: Podiatry

## 2023-12-25 DIAGNOSIS — M79674 Pain in right toe(s): Secondary | ICD-10-CM

## 2023-12-25 DIAGNOSIS — E1140 Type 2 diabetes mellitus with diabetic neuropathy, unspecified: Secondary | ICD-10-CM

## 2023-12-25 DIAGNOSIS — B351 Tinea unguium: Secondary | ICD-10-CM | POA: Diagnosis not present

## 2023-12-25 DIAGNOSIS — M79675 Pain in left toe(s): Secondary | ICD-10-CM | POA: Diagnosis not present

## 2023-12-25 NOTE — Progress Notes (Signed)
This patient returns to my office for at risk foot care.  This patient requires this care by a professional since this patient will be at risk due to having diabetes.   This patient is unable to cut nails himself since the patient cannot reach his nails.These nails are painful walking and wearing shoes.  This patient presents for at risk foot care today.  General Appearance  Alert, conversant and in no acute stress.  Vascular  Dorsalis pedis and posterior tibial  pulses are palpable  bilaterally.  Capillary return is within normal limits  bilaterally. Temperature is within normal limits  bilaterally.  Neurologic  Senn-Weinstein monofilament wire test within normal limits  bilaterally. Muscle power within normal limits bilaterally.  Nails Thick disfigured discolored nails with subungual debris  from hallux to fifth toes bilaterally. No evidence of bacterial infection or drainage bilaterally. Patient has developed a blister at proximal nail fold third toe right foot..  Orthopedic  No limitations of motion  feet .  No crepitus or effusions noted.  Rigid hammer toes 2-4  B/L.  Skin  normotropic skin with no porokeratosis noted bilaterally.  No signs of infections or ulcers noted.    Onychomycosis  Pain in right toes  Pain in left toes    Consent was obtained for treatment procedures.   Mechanical debridement of nails 1-5  bilaterally performed with a nail nipper.  Filed with dremel without incident.     Return office visit   12 weeks                 Told patient to return for periodic foot care and evaluation due to potential at risk complications.   Helane Gunther DPM

## 2023-12-30 DIAGNOSIS — N182 Chronic kidney disease, stage 2 (mild): Secondary | ICD-10-CM | POA: Diagnosis not present

## 2024-01-07 DIAGNOSIS — R809 Proteinuria, unspecified: Secondary | ICD-10-CM | POA: Diagnosis not present

## 2024-01-07 DIAGNOSIS — E1122 Type 2 diabetes mellitus with diabetic chronic kidney disease: Secondary | ICD-10-CM | POA: Diagnosis not present

## 2024-01-07 DIAGNOSIS — N182 Chronic kidney disease, stage 2 (mild): Secondary | ICD-10-CM | POA: Diagnosis not present

## 2024-01-07 DIAGNOSIS — I129 Hypertensive chronic kidney disease with stage 1 through stage 4 chronic kidney disease, or unspecified chronic kidney disease: Secondary | ICD-10-CM | POA: Diagnosis not present

## 2024-01-07 DIAGNOSIS — R609 Edema, unspecified: Secondary | ICD-10-CM | POA: Diagnosis not present

## 2024-01-07 DIAGNOSIS — I10 Essential (primary) hypertension: Secondary | ICD-10-CM | POA: Diagnosis not present

## 2024-01-07 DIAGNOSIS — R600 Localized edema: Secondary | ICD-10-CM | POA: Diagnosis not present

## 2024-01-16 DIAGNOSIS — H35372 Puckering of macula, left eye: Secondary | ICD-10-CM | POA: Diagnosis not present

## 2024-01-16 DIAGNOSIS — E113313 Type 2 diabetes mellitus with moderate nonproliferative diabetic retinopathy with macular edema, bilateral: Secondary | ICD-10-CM | POA: Diagnosis not present

## 2024-01-16 DIAGNOSIS — H43813 Vitreous degeneration, bilateral: Secondary | ICD-10-CM | POA: Diagnosis not present

## 2024-01-16 DIAGNOSIS — H31012 Macula scars of posterior pole (postinflammatory) (post-traumatic), left eye: Secondary | ICD-10-CM | POA: Diagnosis not present

## 2024-03-23 DIAGNOSIS — H31012 Macula scars of posterior pole (postinflammatory) (post-traumatic), left eye: Secondary | ICD-10-CM | POA: Diagnosis not present

## 2024-03-23 DIAGNOSIS — H35372 Puckering of macula, left eye: Secondary | ICD-10-CM | POA: Diagnosis not present

## 2024-03-23 DIAGNOSIS — H43813 Vitreous degeneration, bilateral: Secondary | ICD-10-CM | POA: Diagnosis not present

## 2024-03-23 DIAGNOSIS — E113313 Type 2 diabetes mellitus with moderate nonproliferative diabetic retinopathy with macular edema, bilateral: Secondary | ICD-10-CM | POA: Diagnosis not present

## 2024-03-25 ENCOUNTER — Encounter: Payer: Self-pay | Admitting: Podiatry

## 2024-03-25 ENCOUNTER — Ambulatory Visit (INDEPENDENT_AMBULATORY_CARE_PROVIDER_SITE_OTHER): Payer: Medicare Other | Admitting: Podiatry

## 2024-03-25 DIAGNOSIS — B351 Tinea unguium: Secondary | ICD-10-CM | POA: Diagnosis not present

## 2024-03-25 DIAGNOSIS — M79674 Pain in right toe(s): Secondary | ICD-10-CM

## 2024-03-25 DIAGNOSIS — E1140 Type 2 diabetes mellitus with diabetic neuropathy, unspecified: Secondary | ICD-10-CM | POA: Diagnosis not present

## 2024-03-25 DIAGNOSIS — M79675 Pain in left toe(s): Secondary | ICD-10-CM | POA: Diagnosis not present

## 2024-03-25 NOTE — Progress Notes (Signed)
This patient returns to my office for at risk foot care.  This patient requires this care by a professional since this patient will be at risk due to having diabetes.   This patient is unable to cut nails himself since the patient cannot reach his nails.These nails are painful walking and wearing shoes.  This patient presents for at risk foot care today.  General Appearance  Alert, conversant and in no acute stress.  Vascular  Dorsalis pedis and posterior tibial  pulses are palpable  bilaterally.  Capillary return is within normal limits  bilaterally. Temperature is within normal limits  bilaterally.  Neurologic  Senn-Weinstein monofilament wire test within normal limits  bilaterally. Muscle power within normal limits bilaterally.  Nails Thick disfigured discolored nails with subungual debris  from hallux to fifth toes bilaterally. No evidence of bacterial infection or drainage bilaterally. Patient has developed a blister at proximal nail fold third toe right foot..  Orthopedic  No limitations of motion  feet .  No crepitus or effusions noted.  Rigid hammer toes 2-4  B/L.  Skin  normotropic skin with no porokeratosis noted bilaterally.  No signs of infections or ulcers noted.    Onychomycosis  Pain in right toes  Pain in left toes    Consent was obtained for treatment procedures.   Mechanical debridement of nails 1-5  bilaterally performed with a nail nipper.  Filed with dremel without incident.     Return office visit   12 weeks                 Told patient to return for periodic foot care and evaluation due to potential at risk complications.   Helane Gunther DPM

## 2024-05-24 DIAGNOSIS — E113213 Type 2 diabetes mellitus with mild nonproliferative diabetic retinopathy with macular edema, bilateral: Secondary | ICD-10-CM | POA: Diagnosis not present

## 2024-05-24 DIAGNOSIS — E1165 Type 2 diabetes mellitus with hyperglycemia: Secondary | ICD-10-CM | POA: Diagnosis not present

## 2024-05-24 DIAGNOSIS — E1121 Type 2 diabetes mellitus with diabetic nephropathy: Secondary | ICD-10-CM | POA: Diagnosis not present

## 2024-05-24 DIAGNOSIS — E7800 Pure hypercholesterolemia, unspecified: Secondary | ICD-10-CM | POA: Diagnosis not present

## 2024-05-24 DIAGNOSIS — I10 Essential (primary) hypertension: Secondary | ICD-10-CM | POA: Diagnosis not present

## 2024-05-24 DIAGNOSIS — M170 Bilateral primary osteoarthritis of knee: Secondary | ICD-10-CM | POA: Diagnosis not present

## 2024-05-25 DIAGNOSIS — H35372 Puckering of macula, left eye: Secondary | ICD-10-CM | POA: Diagnosis not present

## 2024-05-25 DIAGNOSIS — H31012 Macula scars of posterior pole (postinflammatory) (post-traumatic), left eye: Secondary | ICD-10-CM | POA: Diagnosis not present

## 2024-05-25 DIAGNOSIS — H43813 Vitreous degeneration, bilateral: Secondary | ICD-10-CM | POA: Diagnosis not present

## 2024-05-25 DIAGNOSIS — E113313 Type 2 diabetes mellitus with moderate nonproliferative diabetic retinopathy with macular edema, bilateral: Secondary | ICD-10-CM | POA: Diagnosis not present

## 2024-06-24 ENCOUNTER — Encounter: Payer: Self-pay | Admitting: Podiatry

## 2024-06-24 ENCOUNTER — Ambulatory Visit: Admitting: Podiatry

## 2024-06-24 DIAGNOSIS — E1140 Type 2 diabetes mellitus with diabetic neuropathy, unspecified: Secondary | ICD-10-CM

## 2024-06-24 DIAGNOSIS — M79674 Pain in right toe(s): Secondary | ICD-10-CM

## 2024-06-24 DIAGNOSIS — M79675 Pain in left toe(s): Secondary | ICD-10-CM

## 2024-06-24 DIAGNOSIS — B351 Tinea unguium: Secondary | ICD-10-CM

## 2024-06-24 NOTE — Progress Notes (Signed)
This patient returns to my office for at risk foot care.  This patient requires this care by a professional since this patient will be at risk due to having diabetes.   This patient is unable to cut nails himself since the patient cannot reach his nails.These nails are painful walking and wearing shoes.  This patient presents for at risk foot care today.  General Appearance  Alert, conversant and in no acute stress.  Vascular  Dorsalis pedis and posterior tibial  pulses are palpable  bilaterally.  Capillary return is within normal limits  bilaterally. Temperature is within normal limits  bilaterally.  Neurologic  Senn-Weinstein monofilament wire test within normal limits  bilaterally. Muscle power within normal limits bilaterally.  Nails Thick disfigured discolored nails with subungual debris  from hallux to fifth toes bilaterally. No evidence of bacterial infection or drainage bilaterally. Patient has developed a blister at proximal nail fold third toe right foot..  Orthopedic  No limitations of motion  feet .  No crepitus or effusions noted.  Rigid hammer toes 2-4  B/L.  Skin  normotropic skin with no porokeratosis noted bilaterally.  No signs of infections or ulcers noted.    Onychomycosis  Pain in right toes  Pain in left toes    Consent was obtained for treatment procedures.   Mechanical debridement of nails 1-5  bilaterally performed with a nail nipper.  Filed with dremel without incident.     Return office visit   12 weeks                 Told patient to return for periodic foot care and evaluation due to potential at risk complications.   Eric Jones DPM

## 2024-07-27 DIAGNOSIS — H31012 Macula scars of posterior pole (postinflammatory) (post-traumatic), left eye: Secondary | ICD-10-CM | POA: Diagnosis not present

## 2024-07-27 DIAGNOSIS — H43813 Vitreous degeneration, bilateral: Secondary | ICD-10-CM | POA: Diagnosis not present

## 2024-07-27 DIAGNOSIS — H35372 Puckering of macula, left eye: Secondary | ICD-10-CM | POA: Diagnosis not present

## 2024-07-27 DIAGNOSIS — E113313 Type 2 diabetes mellitus with moderate nonproliferative diabetic retinopathy with macular edema, bilateral: Secondary | ICD-10-CM | POA: Diagnosis not present

## 2024-08-16 DIAGNOSIS — R52 Pain, unspecified: Secondary | ICD-10-CM | POA: Diagnosis not present

## 2024-08-16 DIAGNOSIS — R6883 Chills (without fever): Secondary | ICD-10-CM | POA: Diagnosis not present

## 2024-08-16 DIAGNOSIS — R051 Acute cough: Secondary | ICD-10-CM | POA: Diagnosis not present

## 2024-08-16 DIAGNOSIS — R0981 Nasal congestion: Secondary | ICD-10-CM | POA: Diagnosis not present

## 2024-08-27 DIAGNOSIS — R053 Chronic cough: Secondary | ICD-10-CM | POA: Diagnosis not present

## 2024-09-03 DIAGNOSIS — Z23 Encounter for immunization: Secondary | ICD-10-CM | POA: Diagnosis not present

## 2024-09-14 ENCOUNTER — Ambulatory Visit: Admitting: Podiatry

## 2024-09-21 ENCOUNTER — Ambulatory Visit (INDEPENDENT_AMBULATORY_CARE_PROVIDER_SITE_OTHER): Admitting: Podiatry

## 2024-09-21 ENCOUNTER — Encounter: Payer: Self-pay | Admitting: Podiatry

## 2024-09-21 DIAGNOSIS — B351 Tinea unguium: Secondary | ICD-10-CM

## 2024-09-21 DIAGNOSIS — E1140 Type 2 diabetes mellitus with diabetic neuropathy, unspecified: Secondary | ICD-10-CM | POA: Diagnosis not present

## 2024-09-21 DIAGNOSIS — M79675 Pain in left toe(s): Secondary | ICD-10-CM

## 2024-09-21 DIAGNOSIS — M79674 Pain in right toe(s): Secondary | ICD-10-CM | POA: Diagnosis not present

## 2024-09-21 NOTE — Progress Notes (Signed)
This patient returns to my office for at risk foot care.  This patient requires this care by a professional since this patient will be at risk due to having diabetes.   This patient is unable to cut nails himself since the patient cannot reach his nails.These nails are painful walking and wearing shoes.  This patient presents for at risk foot care today.  General Appearance  Alert, conversant and in no acute stress.  Vascular  Dorsalis pedis and posterior tibial  pulses are palpable  bilaterally.  Capillary return is within normal limits  bilaterally. Temperature is within normal limits  bilaterally.  Neurologic  Senn-Weinstein monofilament wire test within normal limits  bilaterally. Muscle power within normal limits bilaterally.  Nails Thick disfigured discolored nails with subungual debris  from hallux to fifth toes bilaterally. No evidence of bacterial infection or drainage bilaterally. Patient has developed a blister at proximal nail fold third toe right foot..  Orthopedic  No limitations of motion  feet .  No crepitus or effusions noted.  Rigid hammer toes 2-4  B/L.  Skin  normotropic skin with no porokeratosis noted bilaterally.  No signs of infections or ulcers noted.    Onychomycosis  Pain in right toes  Pain in left toes    Consent was obtained for treatment procedures.   Mechanical debridement of nails 1-5  bilaterally performed with a nail nipper.  Filed with dremel without incident.     Return office visit   12 weeks                 Told patient to return for periodic foot care and evaluation due to potential at risk complications.   Helane Gunther DPM

## 2024-09-22 DIAGNOSIS — H43813 Vitreous degeneration, bilateral: Secondary | ICD-10-CM | POA: Diagnosis not present

## 2024-09-22 DIAGNOSIS — H35372 Puckering of macula, left eye: Secondary | ICD-10-CM | POA: Diagnosis not present

## 2024-09-22 DIAGNOSIS — E113313 Type 2 diabetes mellitus with moderate nonproliferative diabetic retinopathy with macular edema, bilateral: Secondary | ICD-10-CM | POA: Diagnosis not present

## 2024-09-22 DIAGNOSIS — H31012 Macula scars of posterior pole (postinflammatory) (post-traumatic), left eye: Secondary | ICD-10-CM | POA: Diagnosis not present

## 2024-11-30 NOTE — Progress Notes (Signed)
 Eric Jones                                          MRN: [id number]   11/30/2024   The [facility] Quality Team Specialist reviewed this patient medical record for the purposes of chart review for care gap closure. The following were reviewed: chart review for care gap closure-kidney health evaluation for diabetes:eGFR  and uACR.    [facility] Quality Team

## 2024-12-22 ENCOUNTER — Ambulatory Visit: Admitting: Podiatry

## 2024-12-22 ENCOUNTER — Encounter: Payer: Self-pay | Admitting: Podiatry

## 2024-12-22 DIAGNOSIS — B351 Tinea unguium: Secondary | ICD-10-CM

## 2024-12-22 DIAGNOSIS — M79674 Pain in right toe(s): Secondary | ICD-10-CM | POA: Diagnosis not present

## 2024-12-22 DIAGNOSIS — E1140 Type 2 diabetes mellitus with diabetic neuropathy, unspecified: Secondary | ICD-10-CM | POA: Diagnosis not present

## 2024-12-22 DIAGNOSIS — M79675 Pain in left toe(s): Secondary | ICD-10-CM | POA: Diagnosis not present

## 2024-12-22 NOTE — Progress Notes (Signed)
This patient returns to my office for at risk foot care.  This patient requires this care by a professional since this patient will be at risk due to having diabetes.   This patient is unable to cut nails himself since the patient cannot reach his nails.These nails are painful walking and wearing shoes.  This patient presents for at risk foot care today.  General Appearance  Alert, conversant and in no acute stress.  Vascular  Dorsalis pedis and posterior tibial  pulses are palpable  bilaterally.  Capillary return is within normal limits  bilaterally. Temperature is within normal limits  bilaterally.  Neurologic  Senn-Weinstein monofilament wire test within normal limits  bilaterally. Muscle power within normal limits bilaterally.  Nails Thick disfigured discolored nails with subungual debris  from hallux to fifth toes bilaterally. No evidence of bacterial infection or drainage bilaterally. Patient has developed a blister at proximal nail fold third toe right foot..  Orthopedic  No limitations of motion  feet .  No crepitus or effusions noted.  Rigid hammer toes 2-4  B/L.  Skin  normotropic skin with no porokeratosis noted bilaterally.  No signs of infections or ulcers noted.    Onychomycosis  Pain in right toes  Pain in left toes    Consent was obtained for treatment procedures.   Mechanical debridement of nails 1-5  bilaterally performed with a nail nipper.  Filed with dremel without incident.     Return office visit   12 weeks                 Told patient to return for periodic foot care and evaluation due to potential at risk complications.   Helane Gunther DPM

## 2025-03-23 ENCOUNTER — Ambulatory Visit: Admitting: Podiatry
